# Patient Record
Sex: Female | Born: 1945 | Race: Black or African American | Hispanic: No | Smoking: Former smoker
Health system: Southern US, Community
[De-identification: ages and names within clinical notes are randomized; demographics above are authoritative.]

## PROBLEM LIST (undated history)

## (undated) DIAGNOSIS — J449 Chronic obstructive pulmonary disease, unspecified: Secondary | ICD-10-CM

## (undated) DIAGNOSIS — K219 Gastro-esophageal reflux disease without esophagitis: Secondary | ICD-10-CM

## (undated) DIAGNOSIS — Z860100 Personal history of colon polyps, unspecified: Secondary | ICD-10-CM

## (undated) DIAGNOSIS — IMO0001 Reserved for inherently not codable concepts without codable children: Secondary | ICD-10-CM

## (undated) DIAGNOSIS — I10 Essential (primary) hypertension: Secondary | ICD-10-CM

## (undated) DIAGNOSIS — F5104 Psychophysiologic insomnia: Secondary | ICD-10-CM

## (undated) DIAGNOSIS — E785 Hyperlipidemia, unspecified: Secondary | ICD-10-CM

## (undated) DIAGNOSIS — K5792 Diverticulitis of intestine, part unspecified, without perforation or abscess without bleeding: Secondary | ICD-10-CM

## (undated) DIAGNOSIS — I639 Cerebral infarction, unspecified: Secondary | ICD-10-CM

## (undated) HISTORY — DX: Personal history of colonic polyps: Z86.010

## (undated) HISTORY — DX: Diverticulitis of intestine, part unspecified, without perforation or abscess without bleeding: K57.92

## (undated) HISTORY — DX: Reserved for inherently not codable concepts without codable children: IMO0001

## (undated) HISTORY — PX: ABDOMINAL HYSTERECTOMY: SHX81

## (undated) HISTORY — DX: Psychophysiologic insomnia: F51.04

## (undated) HISTORY — DX: Chronic obstructive pulmonary disease, unspecified: J44.9

## (undated) HISTORY — DX: Cerebral infarction, unspecified: I63.9

## (undated) HISTORY — DX: Essential (primary) hypertension: I10

## (undated) HISTORY — PX: THORACIC AORTIC ANEURYSM REPAIR: SHX799

## (undated) HISTORY — DX: Personal history of colon polyps, unspecified: Z86.0100

## (undated) HISTORY — DX: Gastro-esophageal reflux disease without esophagitis: K21.9

## (undated) HISTORY — DX: Hyperlipidemia, unspecified: E78.5

---

## 2009-06-14 ENCOUNTER — Ambulatory Visit (HOSPITAL_COMMUNITY): Admission: RE | Admit: 2009-06-14 | Discharge: 2009-06-14 | Payer: Self-pay | Admitting: Unknown Physician Specialty

## 2009-06-25 ENCOUNTER — Ambulatory Visit (HOSPITAL_COMMUNITY): Admission: RE | Admit: 2009-06-25 | Discharge: 2009-06-25 | Payer: Self-pay | Admitting: Internal Medicine

## 2009-06-28 ENCOUNTER — Ambulatory Visit (HOSPITAL_COMMUNITY): Admission: RE | Admit: 2009-06-28 | Discharge: 2009-06-28 | Payer: Self-pay | Admitting: Unknown Physician Specialty

## 2009-09-09 ENCOUNTER — Encounter: Admission: RE | Admit: 2009-09-09 | Discharge: 2009-09-09 | Payer: Self-pay | Admitting: Neurological Surgery

## 2009-09-21 LAB — HM COLONOSCOPY

## 2009-12-03 ENCOUNTER — Emergency Department (HOSPITAL_COMMUNITY): Admission: EM | Admit: 2009-12-03 | Discharge: 2009-12-03 | Payer: Self-pay | Admitting: Emergency Medicine

## 2010-01-27 ENCOUNTER — Ambulatory Visit: Payer: Self-pay | Admitting: Vascular Surgery

## 2010-02-06 ENCOUNTER — Encounter: Payer: Self-pay | Admitting: Internal Medicine

## 2010-02-13 ENCOUNTER — Ambulatory Visit (HOSPITAL_COMMUNITY): Admission: RE | Admit: 2010-02-13 | Discharge: 2010-02-13 | Payer: Self-pay | Admitting: Unknown Physician Specialty

## 2010-02-19 LAB — HM MAMMOGRAPHY: HM Mammogram: NORMAL

## 2010-02-25 LAB — CONVERTED CEMR LAB: Pap Smear: NORMAL

## 2010-09-08 ENCOUNTER — Ambulatory Visit: Payer: Self-pay | Admitting: Internal Medicine

## 2010-09-08 ENCOUNTER — Encounter: Payer: Self-pay | Admitting: Internal Medicine

## 2010-09-08 DIAGNOSIS — I714 Abdominal aortic aneurysm, without rupture, unspecified: Secondary | ICD-10-CM | POA: Insufficient documentation

## 2010-09-08 DIAGNOSIS — E785 Hyperlipidemia, unspecified: Secondary | ICD-10-CM

## 2010-09-08 DIAGNOSIS — R0789 Other chest pain: Secondary | ICD-10-CM

## 2010-09-08 DIAGNOSIS — Z860100 Personal history of colon polyps, unspecified: Secondary | ICD-10-CM | POA: Insufficient documentation

## 2010-09-08 DIAGNOSIS — I10 Essential (primary) hypertension: Secondary | ICD-10-CM

## 2010-09-08 DIAGNOSIS — Z8679 Personal history of other diseases of the circulatory system: Secondary | ICD-10-CM | POA: Insufficient documentation

## 2010-09-08 DIAGNOSIS — R7309 Other abnormal glucose: Secondary | ICD-10-CM | POA: Insufficient documentation

## 2010-09-08 DIAGNOSIS — J449 Chronic obstructive pulmonary disease, unspecified: Secondary | ICD-10-CM

## 2010-09-08 DIAGNOSIS — K219 Gastro-esophageal reflux disease without esophagitis: Secondary | ICD-10-CM

## 2010-09-08 DIAGNOSIS — Z8719 Personal history of other diseases of the digestive system: Secondary | ICD-10-CM | POA: Insufficient documentation

## 2010-09-08 DIAGNOSIS — J4489 Other specified chronic obstructive pulmonary disease: Secondary | ICD-10-CM | POA: Insufficient documentation

## 2010-09-08 DIAGNOSIS — F17200 Nicotine dependence, unspecified, uncomplicated: Secondary | ICD-10-CM

## 2010-09-08 LAB — CONVERTED CEMR LAB
ALT: 44 units/L — ABNORMAL HIGH (ref 0–35)
AST: 32 units/L (ref 0–37)
Albumin: 3.9 g/dL (ref 3.5–5.2)
Alkaline Phosphatase: 82 units/L (ref 39–117)
Basophils Absolute: 0 10*3/uL (ref 0.0–0.1)
Creatinine, Ser: 0.7 mg/dL (ref 0.4–1.2)
Eosinophils Relative: 2.3 % (ref 0.0–5.0)
HDL goal, serum: 40 mg/dL
Hemoglobin: 13.1 g/dL (ref 12.0–15.0)
Hgb A1c MFr Bld: 5.8 % (ref 4.6–6.5)
Lymphs Abs: 2.4 10*3/uL (ref 0.7–4.0)
MCHC: 34.4 g/dL (ref 30.0–36.0)
MCV: 86.9 fL (ref 78.0–100.0)
Monocytes Absolute: 0.4 10*3/uL (ref 0.1–1.0)
Monocytes Relative: 7.4 % (ref 3.0–12.0)
Neutro Abs: 2.4 10*3/uL (ref 1.4–7.7)
Neutrophils Relative %: 45.2 % (ref 43.0–77.0)
Platelets: 361 10*3/uL (ref 150.0–400.0)
RBC: 4.37 M/uL (ref 3.87–5.11)
TSH: 2.07 microintl units/mL (ref 0.35–5.50)
Total Protein: 7.3 g/dL (ref 6.0–8.3)
Triglycerides: 192 mg/dL — ABNORMAL HIGH (ref 0.0–149.0)

## 2010-09-09 ENCOUNTER — Telehealth (INDEPENDENT_AMBULATORY_CARE_PROVIDER_SITE_OTHER): Payer: Self-pay | Admitting: *Deleted

## 2010-09-09 ENCOUNTER — Encounter: Payer: Self-pay | Admitting: Internal Medicine

## 2010-09-16 ENCOUNTER — Telehealth: Payer: Self-pay | Admitting: Internal Medicine

## 2010-09-16 ENCOUNTER — Ambulatory Visit: Payer: Self-pay | Admitting: Cardiology

## 2010-09-17 ENCOUNTER — Ambulatory Visit (HOSPITAL_COMMUNITY)
Admission: RE | Admit: 2010-09-17 | Discharge: 2010-09-17 | Payer: Self-pay | Source: Home / Self Care | Attending: Internal Medicine | Admitting: Internal Medicine

## 2010-09-18 ENCOUNTER — Encounter: Payer: Self-pay | Admitting: Internal Medicine

## 2010-09-18 ENCOUNTER — Telehealth: Payer: Self-pay | Admitting: Internal Medicine

## 2010-09-18 DIAGNOSIS — R740 Nonspecific elevation of levels of transaminase and lactic acid dehydrogenase [LDH]: Secondary | ICD-10-CM

## 2010-09-18 DIAGNOSIS — E8881 Metabolic syndrome: Secondary | ICD-10-CM | POA: Insufficient documentation

## 2010-09-18 LAB — CONVERTED CEMR LAB
ALT: 17 units/L (ref 0–35)
AST: 19 units/L (ref 0–37)
Albumin: 4 g/dL (ref 3.5–5.2)
Alkaline Phosphatase: 72 units/L (ref 39–117)
Bilirubin, Direct: 0 mg/dL (ref 0.0–0.3)
Hep B Core Total Ab: NEGATIVE
Hep B S Ab: NEGATIVE
Total Protein: 7.8 g/dL (ref 6.0–8.3)

## 2010-09-19 ENCOUNTER — Ambulatory Visit: Payer: Self-pay | Admitting: Internal Medicine

## 2010-09-23 ENCOUNTER — Encounter
Admission: RE | Admit: 2010-09-23 | Discharge: 2010-09-23 | Payer: Self-pay | Source: Home / Self Care | Attending: Internal Medicine | Admitting: Internal Medicine

## 2010-09-25 ENCOUNTER — Telehealth: Payer: Self-pay | Admitting: Internal Medicine

## 2010-09-29 ENCOUNTER — Ambulatory Visit: Admit: 2010-09-29 | Payer: Self-pay | Admitting: Surgery

## 2010-10-12 ENCOUNTER — Encounter: Payer: Self-pay | Admitting: Surgery

## 2010-10-16 ENCOUNTER — Telehealth: Payer: Self-pay | Admitting: Internal Medicine

## 2010-10-20 ENCOUNTER — Ambulatory Visit: Admit: 2010-10-20 | Payer: Self-pay | Admitting: Surgery

## 2010-10-21 NOTE — Letter (Signed)
Summary: [facility] Office Note  [facility] Office Note   Imported By: Roderic Ovens 03/05/2010 09:32:24  _____________________________________________________________________  External Attachment:    Type:   Image     Comment:   External Document

## 2010-10-23 NOTE — Progress Notes (Signed)
  Phone Note Call from Patient Call back at Home Phone [phone]   Caller: Patient---[phone] Call For: [name] MD Summary of Call: Pt seen yesterday and knot found back of left shoulder. Pt states she was given the name of tumor on back of her shoulder but forgets, please advise. Initial call taken by: Verdell Face,  September 09, 2010 9:59 AM  Follow-up for Phone Call        lipoma Follow-up by: [name] MD,  September 09, 2010 10:15 AM

## 2010-10-23 NOTE — Progress Notes (Signed)
Summary: [place] results  Phone Note Call from Patient Call back at Home Phone [phone]   Caller: [phone] Call For: [name] MD Summary of Call: Pt requests call with  results please. Initial call taken by: Verdell Face,  September 25, 2010 11:59 AM  Follow-up for Phone Call        ultrasound shows fatty liver disease Follow-up by: [name] MD,  September 25, 2010 4:06 PM  Additional Follow-up for Phone Call Additional follow up Details #1::        Pt informed. Pt is wondering what the next step is regarding diagnosis and is this something that can be treated? Additional Follow-up by: Brenton Grills CMA Duncan Dull),  September 26, 2010 12:04 PM    Additional Follow-up for Phone Call Additional follow up Details #2::    weight loss, low calorie diet, actos is a med that works as well Follow-up by: [name] MD,  September 26, 2010 12:13 PM  Additional Follow-up for Phone Call Additional follow up Details #3:: Details for Additional Follow-up Action Taken: left message for pt to callback office .Marland KitchenMarland KitchenMarland KitchenBrenton Grills CMA Duncan Dull)  September 26, 2010 2:56 PM   pt informed of MD's advisement Additional Follow-up by: Brenton Grills CMA Duncan Dull),  September 26, 2010 4:13 PM

## 2010-10-23 NOTE — Assessment & Plan Note (Signed)
Summary: PER PT FU--RECEIVED LETTER TO SCHED---STC   Vital Signs:  Patient profile:   65 year old female Menstrual status:  hysterectomy Height:      66 inches (167.64 cm) Weight:      156.38 pounds (71.08 kg) BMI:     25.33 O2 Sat:      97 % on Room air Temp:     98.4 degrees F (36.89 degrees C) oral Pulse rate:   89 / minute Pulse rhythm:   regular Resp:     16 per minute BP sitting:   134 / 78  (left arm) Cuff size:   large  Vitals Entered By: Brenton Grills CMA Duncan Dull) (September 18, 2010 11:31 AM)  Nutrition Counseling: Patient's BMI is greater than 25 and therefore counseled on weight management options.  O2 Flow:  Room air CC: F/U on CT scan, discuss lab results/aj, Lipid Management Is Patient Diabetic? No Pain Assessment Patient in pain? no        Primary Care Provider:  [name] MD  CC:  F/U on CT scan, discuss lab results/aj, and Lipid Management.  History of Present Illness:  Follow-Up Visit      This is a 65 year old woman who presents for Follow-up visit.  The patient denies chest pain, palpitations, dizziness, syncope, low blood sugar symptoms, high blood sugar symptoms, edema, SOB, DOE, PND, and orthopnea.  Since the last visit the patient notes no new problems or concerns.  The patient reports taking meds as prescribed and monitoring BP.  When questioned about possible medication side effects, the patient notes none.  She is concerned about the CT report that raises the possibility of leak in the TAA. She is seeing vasc. surgery tomorrow. She has had no more chest pain. She is concerned about the slightly elevated liver enzymes.  Lipid Management History:      Positive NCEP/ATP III risk factors include female age 33 years old or older, current tobacco user, hypertension, prior stroke (or TIA), peripheral vascular disease, and aortic aneurysm.        The patient states that she knows about the "Therapeutic Lifestyle Change" diet.  Her compliance with the  TLC diet is good.  The patient expresses understanding of adjunctive measures for cholesterol lowering.  Adjunctive measures started by the patient include aerobic exercise, fiber, ASA, omega-3 supplements, limit alcohol consumpton, and weight reduction.  She expresses no side effects from her lipid-lowering medication.  The patient denies any symptoms to suggest myopathy or liver disease.     Preventive Screening-Counseling & Management  Alcohol-Tobacco     Alcohol drinks/day: <1     Alcohol type: all     >5/day in last 3 mos: no     Alcohol Counseling: not indicated; use of alcohol is not excessive or problematic     Feels need to cut down: no     Feels annoyed by complaints: no     Feels guilty re: drinking: no     Needs 'eye opener' in am: no     Smoking Status: current     Smoking Cessation Counseling: yes     Smoke Cessation Stage: precontemplative     Packs/Day: 0.5     Year Started: 1970     Pack years: 30     Tobacco Counseling: to quit use of tobacco products  Hep-HIV-STD-Contraception     Hepatitis Risk: no risk noted     HIV Risk: no risk noted  STD Risk: no risk noted     Dental Visit-last 6 months yes     Dental Care Counseling: to seek dental care; no dental care within six months     SBE monthly: no     SBE Education/Counseling: to perform regular SBE      Sexual History:  currently monogamous.        Drug Use:  never.        Blood Transfusions:  yes and after 2001.    Clinical Review Panels:  Prevention   Last Mammogram:  normal (02/19/2010)   Last Pap Smear:  Normal (02/25/2010)   Last Colonoscopy:  done (09/21/2009)  Immunizations   Last Tetanus Booster:  Historical (09/21/2006)   Last Flu Vaccine:  Fluvax 3+ (09/08/2010)  Lipid Management   Cholesterol:  172 (09/08/2010)   LDL (bad choesterol):  84 (09/08/2010)   HDL (good cholesterol):  49.70 (09/08/2010)  Diabetes Management   HgBA1C:  5.8 (09/08/2010)   Creatinine:  0.7 (09/08/2010)    Last Flu Vaccine:  Fluvax 3+ (09/08/2010)  CBC   WBC:  5.4 (09/08/2010)   RBC:  4.37 (09/08/2010)   Hgb:  13.1 (09/08/2010)   Hct:  38.0 (09/08/2010)   Platelets:  361.0 (09/08/2010)   MCV  86.9 (09/08/2010)   MCHC  34.4 (09/08/2010)   RDW  13.9 (09/08/2010)   PMN:  45.2 (09/08/2010)   Lymphs:  44.5 (09/08/2010)   Monos:  7.4 (09/08/2010)   Eosinophils:  2.3 (09/08/2010)   Basophil:  0.6 (09/08/2010)  Complete Metabolic Panel   Glucose:  81 (09/08/2010)   Sodium:  143 (09/08/2010)   Potassium:  3.6 (09/08/2010)   Chloride:  105 (09/08/2010)   CO2:  29 (09/08/2010)   BUN:  11 (09/08/2010)   Creatinine:  0.7 (09/08/2010)   Albumin:  3.9 (09/08/2010)   Total Protein:  7.3 (09/08/2010)   Calcium:  9.7 (09/08/2010)   Total Bili:  0.9 (09/08/2010)   Alk Phos:  82 (09/08/2010)   SGPT (ALT):  44 (09/08/2010)   SGOT (AST):  32 (09/08/2010)   Current Medications (verified): 1)  Lunesta 2 Mg Tabs (Eszopiclone) .... One Po Qhs As Needed 2)  Tramadol Hcl 50 Mg Tabs (Tramadol Hcl) .... Take 1 Tablet By Mouth Four Times A Day 3)  Amlodipine Besylate 10 Mg Tabs (Amlodipine Besylate) .... Take 1 Tablet By Mouth Once A Day 4)  Metoprolol Succinate 50 Mg Xr24h-Tab (Metoprolol Succinate) .... Take 1 Tablet By Mouth Once A Day 5)  Vytorin 10-20 Mg Tabs (Ezetimibe-Simvastatin) .... Take 1 Tablet By Mouth Once A Day 6)  Aspirin 81 Mg Tbec (Aspirin) .... Take 1 Tablet By Mouth Once A Day 7)  Prednisone 50 Mg Tabs (Prednisone) .... One Tab At 13 Hours, 7 Hours, and 1 Hour Prior To Ct Scan 8)  Diphenhydramine Hcl 50 Mg Caps (Diphenhydramine Hcl) .... One Dose One Hour Prior To Ct Scan  Allergies (verified): 1)  ! Penicillin 2)  ! Percocet 3)  ! Xanax 4)  ! Oxycodone Hcl 5)  ! Codeine  Past History:  Past Medical History: Last updated: 09/08/2010 Left cerebellar stroke - asymptomatic Hypertension Hyperlipidemia Diabetes, borderline Thoracic aneurysm - repaired at [facility] in  2008 Colonic polyps, hx of COPD Diverticulitis, hx of GERD Cerebrovascular accident, hx of Chronic/intermittent insomnia  Past Surgical History: Last updated: 09/08/2010 Thoracic aortic aneurysm repair Hysterectomy  Family History: Last updated: 02/10/2010 Positive for stroke in her mother.   Negative for coronary artery disease or  diabetes.   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  She smokes half a pack of cigarettes per day, has done so for 40+ years.  Does not use alcohol.   Risk Factors: Alcohol Use: <1 (09/18/2010) >5 drinks/d w/in last 3 months: no (09/18/2010)  Risk Factors: Smoking Status: current (09/18/2010) Packs/Day: 0.5 (09/18/2010)  Family History: Reviewed history from 02/10/2010 and no changes required. Positive for stroke in her mother.   Negative for coronary artery disease or diabetes.   Social History: Reviewed history from 02/10/2010 and no changes required. 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  She smokes half a pack of cigarettes per day, has done so for 40+ years.  Does not use alcohol.   Review of Systems  The patient denies anorexia, fever, weight loss, weight gain, chest pain, syncope, dyspnea on exertion, peripheral edema, prolonged cough, headaches, hemoptysis, abdominal pain, hematuria, muscle weakness, suspicious skin lesions, difficulty walking, depression, abnormal bleeding, enlarged lymph nodes, and breast masses.   GI:  Denies abdominal pain, bloody stools, change in bowel habits, constipation, diarrhea, excessive appetite, indigestion, loss of appetite, nausea, vomiting, and yellowish skin color.  Physical Exam  General:  alert, well-developed, well-nourished, well-hydrated, appropriate dress, normal appearance, healthy-appearing, cooperative to examination, and good  hygiene.   Head:  normocephalic, atraumatic, no abnormalities observed, and no abnormalities palpated.   Eyes:  vision grossly intact, pupils equal, pupils round, pupils reactive to light, and no injection.   Mouth:  Oral mucosa and oropharynx without lesions or exudates.  Teeth in good repair. Neck:  supple, full ROM, no masses, no thyromegaly, no thyroid nodules or tenderness, no JVD, normal carotid upstroke, no carotid bruits, no cervical lymphadenopathy, and no neck tenderness.   Lungs:  normal respiratory effort, no intercostal retractions, no accessory muscle use, normal breath sounds, no dullness, no fremitus, no crackles, and no wheezes.   Heart:  normal rate, regular rhythm, no murmur, no gallop, no rub, and no JVD.   Abdomen:  soft, non-tender, normal bowel sounds, no distention, no masses, no guarding, no rigidity, no rebound tenderness, no abdominal hernia, no inguinal hernia, no hepatomegaly, and no splenomegaly.   Msk:  No deformity or scoliosis noted of thoracic or lumbar spine.   Pulses:  R and L carotid,radial,femoral,dorsalis pedis and posterior tibial pulses are full and equal bilaterally Extremities:  No clubbing, cyanosis, edema, or deformity noted with normal full range of motion of all joints.   Neurologic:  No cranial nerve deficits noted. Station and gait are normal. Plantar reflexes are down-going bilaterally. DTRs are symmetrical throughout. Sensory, motor and coordinative functions appear intact. Skin:  Intact without suspicious lesions or rashes Cervical Nodes:  no anterior cervical adenopathy and no posterior cervical adenopathy.   Psych:  Cognition and judgment appear intact. Alert and cooperative with normal attention span and concentration. No apparent delusions, illusions, hallucinations   Impression & Recommendations:  Problem # 1:  TRANSAMINASES, SERUM, ELEVATED (ICD-790.4) Assessment New  Orders: TLB-Hepatic/Liver Function Pnl (80076-HEPATIC) TLB-Ferritin  (82728-FER) T-Hepatitis C Anti HCV (16109) T-Hepatitis B Surface Antigen (60454-09811) T-Hepatitis B Surface Antibody (91478-29562) T-Hepatitis A Antibody (13086-57846) T-Hepatitis B Core Total [phone]) T-Sprue Panel (Celiac Disease Aby Eval) (83516x3/[address]) T-AMA (24401) Radiology Referral (Radiology)  Problem # 2:  HYPERTENSION (ICD-401.9) Assessment: Improved  Her updated medication list for this  problem includes:    Amlodipine Besylate 10 Mg Tabs (Amlodipine besylate) .Marland Kitchen... Take 1 tablet by mouth once a day    Metoprolol Succinate 50 Mg Xr24h-tab (Metoprolol succinate) .Marland Kitchen... Take 1 tablet by mouth once a day  BP today: 140/78 Prior BP: 106/68 (09/08/2010)  Prior 10 Yr Risk Heart Disease: Not enough information (09/08/2010)  Labs Reviewed: K+: 3.6 (09/08/2010) Creat: : 0.7 (09/08/2010)   Chol: 172 (09/08/2010)   HDL: 49.70 (09/08/2010)   LDL: 84 (09/08/2010)   TG: 192.0 (09/08/2010)  Orders: Tobacco use cessation intermediate 3-10 minutes (16109)  Problem # 3:  HYPERLIPIDEMIA (ICD-272.4) Assessment: Unchanged  Her updated medication list for this problem includes:    Vytorin 10-20 Mg Tabs (Ezetimibe-simvastatin) .Marland Kitchen... Take 1 tablet by mouth once a day  Labs Reviewed: SGOT: 32 (09/08/2010)   SGPT: 44 (09/08/2010)  Lipid Goals: Chol Goal: 200 (09/08/2010)   HDL Goal: 40 (09/08/2010)   LDL Goal: 70 (09/08/2010)   TG Goal: 150 (09/08/2010)  Prior 10 Yr Risk Heart Disease: Not enough information (09/08/2010)   HDL:49.70 (09/08/2010)  LDL:84 (09/08/2010)  Chol:172 (09/08/2010)  Trig:192.0 (09/08/2010)  Problem # 4:  TOBACCO USE (ICD-305.1) Assessment: Unchanged  Encouraged smoking cessation and discussed different methods for smoking cessation.   Orders: Tobacco use cessation intermediate 3-10 minutes (60454)  Problem # 5:  DYSMETABOLIC SYNDROME (ICD-277.7) Assessment: New  Problem # 6:  HYPERGLYCEMIA (ICD-790.29) Assessment: Unchanged  Labs  Reviewed: Creat: 0.7 (09/08/2010)     Complete Medication List: 1)  Lunesta 2 Mg Tabs (Eszopiclone) .... One po qhs as needed 2)  Tramadol Hcl 50 Mg Tabs (Tramadol hcl) .... Take 1 tablet by mouth four times a day 3)  Amlodipine Besylate 10 Mg Tabs (Amlodipine besylate) .... Take 1 tablet by mouth once a day 4)  Metoprolol Succinate 50 Mg Xr24h-tab (Metoprolol succinate) .... Take 1 tablet by mouth once a day 5)  Vytorin 10-20 Mg Tabs (Ezetimibe-simvastatin) .... Take 1 tablet by mouth once a day 6)  Aspirin 81 Mg Tbec (Aspirin) .... Take 1 tablet by mouth once a day 7)  Prednisone 50 Mg Tabs (Prednisone) .... One tab at 13 hours, 7 hours, and 1 hour prior to ct scan 8)  Diphenhydramine Hcl 50 Mg Caps (Diphenhydramine hcl) .... One dose one hour prior to ct scan  Lipid Assessment/Plan:      Based on NCEP/ATP III, the patient's risk factor category is "history of coronary disease, peripheral vascular disease, cerebrovascular disease, or aortic aneurysm along with either diabetes, current smoker, or LDL > 130 plus HDL < 40 plus triglycerides > 200".  The patient's lipid goals are as follows: Total cholesterol goal is 200; LDL cholesterol goal is 70; HDL cholesterol goal is 40; Triglyceride goal is 150.    Patient Instructions: 1)  Please schedule a follow-up appointment in 1 month. 2)  Tobacco is very bad for your health and your loved ones! You Should stop smoking!. 3)  Stop Smoking Tips: Choose a Quit date. Cut down before the Quit date. decide what you will do as a substitute when you feel the urge to smoke(gum,toothpick,exercise). 4)  It is important that you exercise regularly at least 20 minutes 5 times a week. If you develop chest pain, have severe difficulty breathing, or feel very tired , stop exercising immediately and seek medical attention. 5)  You need to lose weight. Consider a lower calorie diet and regular exercise.  6)  Check your Blood Pressure regularly. If it is above 130/80:  you should make an appointment.   Orders Added: 1)  TLB-Hepatic/Liver Function Pnl [80076-HEPATIC] 2)  TLB-Ferritin [82728-FER] 3)  T-Hepatitis C Anti HCV [16109] 4)  T-Hepatitis B Surface Antigen [60454-09811] 5)  T-Hepatitis B Surface Antibody [91478-29562] 6)  T-Hepatitis A Antibody [13086-57846] 7)  T-Hepatitis B Core Total [86704-23570] 8)  T-Sprue Panel (Celiac Disease Aby Eval) [83516x3/[address]] 9)  T-AMA [96295] 10)  Radiology Referral [Radiology] 11)  Tobacco use cessation intermediate 3-10 minutes [99406] 12)  Est. Patient Level IV [28413]

## 2010-10-23 NOTE — Assessment & Plan Note (Signed)
Summary: New / [employer] / # / cd   Vital Signs:  Patient profile:   65 year old female Menstrual status:  hysterectomy Height:      66 inches Weight:      156.25 pounds BMI:     25.31 O2 Sat:      94 % on Room air Temp:     98.3 degrees F oral Pulse rate:   65 / minute Pulse rhythm:   regular Resp:     16 per minute BP sitting:   106 / 68  (left arm) Cuff size:   large  Vitals Entered By: Rock Nephew CMA (September 08, 2010 1:26 PM)  Nutrition Counseling: Patient's BMI is greater than 25 and therefore counseled on weight management options.  O2 Flow:  Room air  Primary Care Provider:  [name] MD   History of Present Illness: New to me she needs a new PCP. She comaplins of a 2 week history of chest pain that started on the right lateral chest wall then migrated to the back and then the left side. She describes it as a sharp spasm that comes and goes very quickly.  Lipid Management History:      Positive NCEP/ATP III risk factors include female age 31 years old or older, current tobacco user, hypertension, prior stroke (or TIA), peripheral vascular disease, and aortic aneurysm.        The patient states that she knows about the "Therapeutic Lifestyle Change" diet.  Her compliance with the TLC diet is good.  The patient expresses understanding of adjunctive measures for cholesterol lowering.  Adjunctive measures started by the patient include aerobic exercise, fiber, ASA, folic acid, limit alcohol consumpton, and weight reduction.  She expresses no side effects from her lipid-lowering medication.  The patient denies any symptoms to suggest myopathy or liver disease.     Preventive Screening-Counseling & Management  Alcohol-Tobacco     Alcohol drinks/day: <1     Alcohol type: all     >5/day in last 3 mos: no     Alcohol Counseling: not indicated; use of alcohol is not excessive or problematic     Feels need to cut down: no     Feels annoyed by complaints: no     Feels  guilty re: drinking: no     Needs 'eye opener' in am: no     Smoking Status: current     Smoking Cessation Counseling: yes     Smoke Cessation Stage: precontemplative     Packs/Day: 0.5     Year Started: 1970     Pack years: 30     Tobacco Counseling: to quit use of tobacco products  Hep-HIV-STD-Contraception     Hepatitis Risk: no risk noted     HIV Risk: no risk noted     STD Risk: no risk noted     Dental Visit-last 6 months yes     Dental Care Counseling: to seek dental care; no dental care within six months     SBE monthly: no     SBE Education/Counseling: to perform regular SBE      Sexual History:  currently monogamous.        Drug Use:  never.        Blood Transfusions:  yes and after 2001.    Clinical Review Panels:  Prevention   Last Mammogram:  normal (02/19/2010)   Last Pap Smear:  Normal (02/25/2010)   Last Colonoscopy:  done (09/21/2009)  Immunizations   Last Tetanus Booster:  Historical (09/21/2006)   Last Flu Vaccine:  Fluvax 3+ (09/08/2010)  Diabetes Management   Last Flu Vaccine:  Fluvax 3+ (09/08/2010)   Medications Prior to Update: 1)  None  Current Medications (verified): 1)  Lunesta 2 Mg Tabs (Eszopiclone) .... One Po Qhs As Needed 2)  Tramadol Hcl 50 Mg Tabs (Tramadol Hcl) .... Take 1 Tablet By Mouth Four Times A Day 3)  Amlodipine Besylate 10 Mg Tabs (Amlodipine Besylate) .... Take 1 Tablet By Mouth Once A Day 4)  Metoprolol Succinate 50 Mg Xr24h-Tab (Metoprolol Succinate) .... Take 1 Tablet By Mouth Once A Day 5)  Vytorin 10-20 Mg Tabs (Ezetimibe-Simvastatin) .... Take 1 Tablet By Mouth Once A Day 6)  Aspirin 81 Mg Tbec (Aspirin) .... Take 1 Tablet By Mouth Once A Day  Allergies (verified): 1)  ! Penicillin 2)  ! Percocet 3)  ! Xanax 4)  ! Oxycodone Hcl 5)  ! Codeine  Past History:  Past Medical History: Left cerebellar stroke - asymptomatic Hypertension Hyperlipidemia Diabetes, borderline Thoracic aneurysm - repaired at [facility] in 2008 Colonic polyps, hx of COPD Diverticulitis, hx of GERD Cerebrovascular accident, hx of Chronic/intermittent insomnia  Past Surgical History: Thoracic aortic aneurysm repair Hysterectomy  Social History: Smoking Status:  current Packs/Day:  0.5 Hepatitis Risk:  no risk noted HIV Risk:  no risk noted STD Risk:  no risk noted Dental Care w/in 6 mos.:  yes Sexual History:  currently monogamous Drug Use:  never Blood Transfusions:  yes, after 2001  Review of Systems       The patient complains of chest pain.  The patient denies anorexia, fever, weight loss, weight gain, hoarseness, syncope, dyspnea on exertion, peripheral edema, prolonged cough, headaches, hemoptysis, abdominal pain, melena, hematochezia, severe indigestion/heartburn, hematuria, incontinence, genital sores, muscle weakness, suspicious skin lesions, transient blindness, difficulty walking, depression, abnormal bleeding, enlarged lymph nodes, angioedema, and breast masses.   CV:  Complains of chest pain or discomfort; denies bluish discoloration of lips or nails, difficulty breathing at night, difficulty breathing while lying down, fainting, fatigue, leg cramps with exertion, lightheadness, near fainting, palpitations, shortness of breath with exertion, swelling of feet, swelling of hands, and weight gain. Resp:  Complains of chest discomfort; denies chest pain with inspiration, cough, coughing up blood, excessive snoring, hypersomnolence, morning headaches, pleuritic, shortness of breath, sputum productive, and wheezing. Endo:  Denies cold intolerance, excessive hunger, excessive thirst, excessive urination, heat intolerance, polyuria, and weight change.  Physical Exam  General:  alert, well-developed, well-nourished, well-hydrated, appropriate dress, normal appearance, healthy-appearing, cooperative to examination, and good hygiene.   Head:  normocephalic, atraumatic, no abnormalities observed, and no  abnormalities palpated.   Eyes:  vision grossly intact, pupils equal, pupils round, pupils reactive to light, and no injection.   Ears:  R ear normal and L ear normal.   Nose:  External nasal examination shows no deformity or inflammation. Nasal mucosa are pink and moist without lesions or exudates. Mouth:  Oral mucosa and oropharynx without lesions or exudates.  Teeth in good repair. Neck:  supple, full ROM, no masses, no thyromegaly, no thyroid nodules or tenderness, no JVD, normal carotid upstroke, no carotid bruits, no cervical lymphadenopathy, and no neck tenderness.   Breasts:  skin/areolae normal, no masses, no abnormal thickening, no nipple discharge, no tenderness, and no adenopathy.   Lungs:  normal respiratory effort, no intercostal retractions, no accessory muscle use, normal breath sounds, no dullness, no fremitus, no crackles,  and no wheezes.   Heart:  normal rate, regular rhythm, no murmur, no gallop, no rub, and no JVD.   Abdomen:  soft, non-tender, normal bowel sounds, no distention, no masses, no guarding, no rigidity, no rebound tenderness, no abdominal hernia, no inguinal hernia, no hepatomegaly, and no splenomegaly.   Msk:  No deformity or scoliosis noted of thoracic or lumbar spine.   Pulses:  R and L carotid,radial,femoral,dorsalis pedis and posterior tibial pulses are full and equal bilaterally Extremities:  No clubbing, cyanosis, edema, or deformity noted with normal full range of motion of all joints.   Neurologic:  No cranial nerve deficits noted. Station and gait are normal. Plantar reflexes are down-going bilaterally. DTRs are symmetrical throughout. Sensory, motor and coordinative functions appear intact. Skin:  Intact without suspicious lesions or rashes Cervical Nodes:  no anterior cervical adenopathy and no posterior cervical adenopathy.   Axillary Nodes:  no R axillary adenopathy and no L axillary adenopathy.   Inguinal Nodes:  no R inguinal adenopathy and no L  inguinal adenopathy.   Psych:  Cognition and judgment appear intact. Alert and cooperative with normal attention span and concentration. No apparent delusions, illusions, hallucinations Additional Exam:  EKG is normal   Impression & Recommendations:  Problem # 1:  CHEST PAIN, ATYPICAL (ICD-786.59) Assessment New her pain is very atypical, with her history of TAA I will check a chest xray Orders: T-2 View CXR (71020TC) EKG w/ Interpretation (93000)  Problem # 2:  HYPERGLYCEMIA (ICD-790.29) Assessment: Unchanged  Orders: Venipuncture (16109) TLB-Lipid Panel (80061-LIPID) TLB-BMP (Basic Metabolic Panel-BMET) (80048-METABOL) TLB-CBC Platelet - w/Differential (85025-CBCD) TLB-Hepatic/Liver Function Pnl (80076-HEPATIC) TLB-TSH (Thyroid Stimulating Hormone) (84443-TSH) TLB-A1C / Hgb A1C (Glycohemoglobin) (83036-A1C)  Problem # 3:  HYPERTENSION (ICD-401.9) Assessment: Improved  Her updated medication list for this problem includes:    Amlodipine Besylate 10 Mg Tabs (Amlodipine besylate) .Marland Kitchen... Take 1 tablet by mouth once a day    Metoprolol Succinate 50 Mg Xr24h-tab (Metoprolol succinate) .Marland Kitchen... Take 1 tablet by mouth once a day  Orders: Venipuncture (60454) TLB-Lipid Panel (80061-LIPID) TLB-BMP (Basic Metabolic Panel-BMET) (80048-METABOL) TLB-CBC Platelet - w/Differential (85025-CBCD) TLB-Hepatic/Liver Function Pnl (80076-HEPATIC) TLB-TSH (Thyroid Stimulating Hormone) (84443-TSH) TLB-A1C / Hgb A1C (Glycohemoglobin) (83036-A1C) Tobacco use cessation intermediate 3-10 minutes (99406)  BP today: 106/68  10 Yr Risk Heart Disease: Not enough information  Problem # 4:  AAA (ICD-441.4) Assessment: Unchanged she needs an updated CT of chest and abd to follow the aneurysm s/p repair Orders: Radiology Referral (Radiology) Venipuncture (09811) TLB-Lipid Panel (80061-LIPID) TLB-BMP (Basic Metabolic Panel-BMET) (80048-METABOL) TLB-CBC Platelet - w/Differential  (85025-CBCD) TLB-Hepatic/Liver Function Pnl (80076-HEPATIC) TLB-TSH (Thyroid Stimulating Hormone) (84443-TSH) TLB-A1C / Hgb A1C (Glycohemoglobin) (83036-A1C) T-2 View CXR (71020TC) Tobacco use cessation intermediate 3-10 minutes (91478)  Problem # 5:  TOBACCO USE (ICD-305.1) Assessment: Unchanged  Encouraged smoking cessation and discussed different methods for smoking cessation.   Orders: Tobacco use cessation intermediate 3-10 minutes (29562)  Problem # 6:  HYPERLIPIDEMIA (ICD-272.4) Assessment: Unchanged  Her updated medication list for this problem includes:    Vytorin 10-20 Mg Tabs (Ezetimibe-simvastatin) .Marland Kitchen... Take 1 tablet by mouth once a day  Orders: Venipuncture (13086) TLB-Lipid Panel (80061-LIPID) TLB-BMP (Basic Metabolic Panel-BMET) (80048-METABOL) TLB-CBC Platelet - w/Differential (85025-CBCD) TLB-Hepatic/Liver Function Pnl (80076-HEPATIC) TLB-TSH (Thyroid Stimulating Hormone) (84443-TSH) TLB-A1C / Hgb A1C (Glycohemoglobin) (83036-A1C)  Lipid Goals: Chol Goal: 200 (09/08/2010)   HDL Goal: 40 (09/08/2010)   LDL Goal: 70 (09/08/2010)   TG Goal: 150 (09/08/2010)  10 Yr Risk Heart Disease: Not enough  information  Complete Medication List: 1)  Lunesta 2 Mg Tabs (Eszopiclone) .... One po qhs as needed 2)  Tramadol Hcl 50 Mg Tabs (Tramadol hcl) .... Take 1 tablet by mouth four times a day 3)  Amlodipine Besylate 10 Mg Tabs (Amlodipine besylate) .... Take 1 tablet by mouth once a day 4)  Metoprolol Succinate 50 Mg Xr24h-tab (Metoprolol succinate) .... Take 1 tablet by mouth once a day 5)  Vytorin 10-20 Mg Tabs (Ezetimibe-simvastatin) .... Take 1 tablet by mouth once a day 6)  Aspirin 81 Mg Tbec (Aspirin) .... Take 1 tablet by mouth once a day  Other Orders: Flu Vaccine 95yrs + (13086) Admin 1st Vaccine (57846)  Lipid Assessment/Plan:      Based on NCEP/ATP III, the patient's risk factor category is "history of coronary disease, peripheral vascular disease,  cerebrovascular disease, or aortic aneurysm along with either diabetes, current smoker, or LDL > 130 plus HDL < 40 plus triglycerides > 200".  The patient's lipid goals are as follows: Total cholesterol goal is 200; LDL cholesterol goal is 70; HDL cholesterol goal is 40; Triglyceride goal is 150.    Colorectal Screening:  Current Recommendations:    Hemoccult: patient refused  PAP Screening:    Hx Cervical Dysplasia in last 5 yrs? No    3 normal PAP smears in last 5 yrs? Yes    Last PAP smear:  02/25/2010  PAP Smear Results:    Date of Exam:  02/25/2010    Results:  Normal  Mammogram Screening:    Last Mammogram:  02/19/2010  Osteoporosis Risk Assessment:  Risk Factors for Fracture or Low Bone Density:   Smoking status:       current  Immunization & Chemoprophylaxis:    Tetanus vaccine: Historical  (09/21/2006)    Influenza vaccine: Fluvax 3+  (09/08/2010)  Patient Instructions: 1)  Please schedule a follow-up appointment in 1 month. 2)  Tobacco is very bad for your health and your loved ones! You Should stop smoking!. 3)  Stop Smoking Tips: Choose a Quit date. Cut down before the Quit date. decide what you will do as a substitute when you feel the urge to smoke(gum,toothpick,exercise). 4)  It is important that you exercise regularly at least 20 minutes 5 times a week. If you develop chest pain, have severe difficulty breathing, or feel very tired , stop exercising immediately and seek medical attention. 5)  You need to lose weight. Consider a lower calorie diet and regular exercise.  6)  Check your Blood Pressure regularly. If it is above 130/80: you should make an appointment. Prescriptions: VYTORIN 10-20 MG TABS (EZETIMIBE-SIMVASTATIN) Take 1 tablet by mouth once a day  #90 x 3   Entered and Authorized by:   [name] MD   Signed by:   [name] MD on 09/08/2010   Method used:   Electronically to        [facility]* (retail)             ,          Ph:  [phone]       Fax: [phone]   RxID:   [id number] METOPROLOL SUCCINATE 50 MG XR24H-TAB (METOPROLOL SUCCINATE) Take 1 tablet by mouth once a day  #90 x 3   Entered and Authorized by:   [name] MD   Signed by:   [name] MD on 09/08/2010   Method used:   Electronically to  [facility]* (retail)             ,          Ph: [phone]       Fax: [phone]   RxID:   [id number] AMLODIPINE BESYLATE 10 MG TABS (AMLODIPINE BESYLATE) Take 1 tablet by mouth once a day  #90 x 3   Entered and Authorized by:   [name] MD   Signed by:   [name] MD on 09/08/2010   Method used:   Electronically to        [facility]* (retail)             ,          Ph: [phone]       Fax: [phone]   RxID:   [id number] LUNESTA 2 MG TABS (ESZOPICLONE) one po qhs as needed  #30 x 3   Entered and Authorized by:   [name] MD   Signed by:   [name] MD on 09/08/2010   Method used:   Print then Give to Patient   RxID:   [id number]    Orders Added: 1)  Radiology Referral [Radiology] 2)  Venipuncture [01093] 3)  TLB-Lipid Panel [80061-LIPID] 4)  TLB-BMP (Basic Metabolic Panel-BMET) [80048-METABOL] 5)  TLB-CBC Platelet - w/Differential [85025-CBCD] 6)  TLB-Hepatic/Liver Function Pnl [80076-HEPATIC] 7)  TLB-TSH (Thyroid Stimulating Hormone) [84443-TSH] 8)  TLB-A1C / Hgb A1C (Glycohemoglobin) [83036-A1C] 9)  T-2 View CXR [71020TC] 10)  Flu Vaccine 63yrs + [90658] 11)  Admin 1st Vaccine [90471] 12)  EKG w/ Interpretation [93000] 13)  Tobacco use cessation intermediate 3-10 minutes [99406] 14)  New Patient Level V [99205]   Immunizations Administered:  Influenza Vaccine # 1:    Vaccine Type: Fluvax 3+    Site: right deltoid    Mfr: Sanofi Pasteur    Dose: 0.5 ml    Route: IM    Given by: Rock Nephew CMA    Exp. Date: 03/21/2011    Lot #: [id number]    VIS given: 04/15/10 version given September 08, 2010.   Immunizations Administered:  Influenza Vaccine # 1:    Vaccine Type: Fluvax 3+    Site: right deltoid    Mfr: Sanofi Pasteur    Dose: 0.5 ml    Route: IM    Given by: Rock Nephew CMA    Exp. Date: 03/21/2011    Lot #: [id number]    VIS given: 04/15/10 version given September 08, 2010.  Preventive Care Screening  Mammogram:    Date:  02/19/2010    Results:  normal   Pap Smear:    Date:  02/19/2010    Results:  normal   Colonoscopy:    Date:  09/21/2009    Results:  done   Last Tetanus Booster:    Date:  09/21/2006    Results:  Historical

## 2010-10-23 NOTE — Letter (Signed)
Summary: Results Follow-up Letter  [facility]  [address] [place], [place] [address]   Phone: [phone]  Fax: [phone]    09/09/2010  [address] [place], [place]  [address]  Dear Ms. Colombe,   The following are the results of your recent test(s):  Test     Result     Chest Xray     normal Blood sugars   pre-diabetes Liver       one enzyme elevation Kidney     normal CBC       normal Thyroid     normal   _________________________________________________________  Please call for an appointment soon _________________________________________________________ _________________________________________________________ _________________________________________________________  Sincerely,  Sanda Linger MD [facility]

## 2010-10-23 NOTE — Progress Notes (Signed)
Summary: REFERRAL?   Phone Note Call from Patient   Summary of Call: Patient is requesting referral to see a hematologist..... Say our office told her that she had elevated liver enzymes and that is why she wants referral... Initial call taken by: Lamar Sprinkles, CMA,  October 16, 2010 4:22 PM  Follow-up for Phone Call        this makes no sense at all Follow-up by: [name] MD,  October 16, 2010 4:24 PM  Additional Follow-up for Phone Call Additional follow up Details #1::        Spoke w/pt and explained that last liver enzymes were normal (and hematologist does NOT treat this). She should continue wt loss, exercise and healthy diet. Pt agreed.  Additional Follow-up by: Lamar Sprinkles, CMA,  October 16, 2010 6:13 PM

## 2010-10-23 NOTE — Letter (Signed)
Summary: Lipid Letter  [facility]  [address] [place], [place] [address]   Phone: [phone]  Fax: [phone]    09/09/2010  Kim Rowland [address] [place], [place]  [address]  Dear Ms. Klinker:  We have carefully reviewed your last lipid profile from 09/08/2010 and the results are noted below with a summary of recommendations for lipid management.    Cholesterol:       172     Goal: <200   HDL "good" Cholesterol:   01.75     Goal: >40   LDL "bad" Cholesterol:   84     Goal: <70   Triglycerides:       192.0     Goal: <150        TLC Diet (Therapeutic Lifestyle Change): Saturated Fats & Transfatty acids should be kept < 7% of total calories ***Reduce Saturated Fats Polyunstaurated Fat can be up to 10% of total calories Monounsaturated Fat Fat can be up to 20% of total calories Total Fat should be no greater than 25-35% of total calories Carbohydrates should be 50-60% of total calories Protein should be approximately 15% of total calories Fiber should be at least 20-30 grams a day ***Increased fiber may help lower LDL Total Cholesterol should be < 200mg /day Consider adding plant stanol/sterols to diet (example: Benacol spread) ***A higher intake of unsaturated fat may reduce Triglycerides and Increase HDL    Adjunctive Measures (may lower LIPIDS and reduce risk of Heart Attack) include: Aerobic Exercise (20-30 minutes 3-4 times a week) Limit Alcohol Consumption Weight Reduction Aspirin 75-81 mg a day by mouth (if not allergic or contraindicated) Dietary Fiber 20-30 grams a day by mouth     Current Medications: 1)    Lunesta 2 Mg Tabs (Eszopiclone) .... One po qhs as needed 2)    Tramadol Hcl 50 Mg Tabs (Tramadol hcl) .... Take 1 tablet by mouth four times a day 3)    Amlodipine Besylate 10 Mg Tabs (Amlodipine besylate) .... Take 1 tablet by mouth once a day 4)    Metoprolol Succinate 50 Mg Xr24h-tab (Metoprolol succinate) .... Take 1  tablet by mouth once a day 5)    Vytorin 10-20 Mg Tabs (Ezetimibe-simvastatin) .... Take 1 tablet by mouth once a day 6)    Aspirin 81 Mg Tbec (Aspirin) .... Take 1 tablet by mouth once a day  If you have any questions, please call. We appreciate being able to work with you.   Sincerely,    [facility] [name] MD

## 2010-10-23 NOTE — Progress Notes (Signed)
Summary: Allergy to IV contrast-Need Rx and new Order  Phone Note Other Incoming   Caller: Kim Rowland in CT Summary of Call: pt is allergic to IV contrast. She is there now but they are informing that she cant have CT today and we will call in appropriate regimen for her to take prior to next CT and our [facility] will contact her with new date/time. Please advise on Rx and put in new order to [facility]. Initial call taken by: Lanier Prude, [facility]),  September 16, 2010 8:46 AM  Follow-up for Phone Call        what does the radiologist recommend for pre-treatment? Follow-up by: [name] MD,  September 16, 2010 8:55 AM  Additional Follow-up for Phone Call Additional follow up Details #1::        Pt called wants whatever is going to be given to her be  called into [facility] Pt rs'd CT for 12/28@3 :30pm. Please advise pt. Additional Follow-up by: Verdell Face,  September 16, 2010 8:59 AM    Additional Follow-up for Phone Call Additional follow up Details #2::    Kim Rowland from CT is faxing radiologist standard Rx regimen. Lanier Prude, [facility])  September 16, 2010 9:02 AM   [facility] Pre-med regimen for pts w/iodinated contrast reaction is: 1. prednisone 50mg  OR methylprednisolone 32mg  by mouth at 13 hrs, 7 hours and 1 hour prior to study 2. benadryl 50mg  by mouth at 1 hour prior to study.  Please advise...................Marland KitchenLamar Sprinkles, CMA  September 16, 2010 10:08 AM   Pt informed  Follow-up by: Lamar Sprinkles, CMA,  September 16, 2010 10:29 AM  New/Updated Medications: PREDNISONE 50 MG TABS (PREDNISONE) one tab at 13 hours, 7 hours, and 1 hour prior to CT scan DIPHENHYDRAMINE HCL 50 MG CAPS (DIPHENHYDRAMINE HCL) one dose one hour prior to CT scan Prescriptions: PREDNISONE 50 MG TABS (PREDNISONE) one tab at 13 hours, 7 hours, and 1 hour prior to CT scan  #3 x 1   Entered by:   Lamar Sprinkles, CMA   Authorized by:   [name] MD   Signed by:   Lamar Sprinkles, CMA on 09/16/2010   Method  used:   Electronically to        [facility] [address] [id number]* (retail)       [address] [place], [place]  [address]       Ph: [phone]       Fax: [phone]   RxID:   [id number] DIPHENHYDRAMINE HCL 50 MG CAPS (DIPHENHYDRAMINE HCL) one dose one hour prior to CT scan  #1 x 1   Entered by:   Lamar Sprinkles, CMA   Authorized by:   [name] MD   Signed by:   Lamar Sprinkles, CMA on 09/16/2010   Method used:   Electronically to        [facility] [address] #[id number]* (retail)       [address] [place], [place]  [address]       Ph: [phone]       Fax: [phone]   RxID:   [id number] DIPHENHYDRAMINE HCL 50 MG CAPS (DIPHENHYDRAMINE HCL) one dose one hour prior to CT scan  #1 x 1   Entered and Authorized by:   [name] MD   Signed by:   [name] MD on 09/16/2010   Method used:   Historical   RxID:  [id number] PREDNISONE 50 MG TABS (PREDNISONE) one tab at 13 hours, 7 hours, and 1 hour prior to CT scan  #3 x 1   Entered and Authorized by:   [name] MD   Signed by:   [name] MD on 09/16/2010   Method used:   Historical   RxID:   [id number]

## 2010-10-23 NOTE — Progress Notes (Signed)
  Phone Note Outgoing Call   Summary of Call: Kim Rowland - her CT scan showed a POSSIBLE leak in her aneurysm, please let her know that I have done an updated referral to her surgeon.  TJ Initial call taken by: [name] MD,  September 18, 2010 7:26 AM  Follow-up for Phone Call        pt informed of results. Pt states that she has an appointment with her surgeon today at 11:00am Follow-up by: Brenton Grills CMA [facility]),  September 18, 2010 8:38 AM

## 2010-11-17 ENCOUNTER — Encounter (INDEPENDENT_AMBULATORY_CARE_PROVIDER_SITE_OTHER): Payer: Medicare HMO | Admitting: Surgery

## 2010-11-17 DIAGNOSIS — I712 Thoracic aortic aneurysm, without rupture: Secondary | ICD-10-CM

## 2010-11-18 NOTE — Assessment & Plan Note (Signed)
OFFICE VISIT  Kim Rowland, Kim Rowland DOB:  1946/02/26                                       11/17/2010 WJXBJ#:[id number]  REASON FOR VISIT:  Followup thoracic aneurysm.  HISTORY:  This is a 65 year old female that was seen initially by Dr. Hart Rochester in May of 2011.  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  The patient was referred to Dr. Hart Rochester because of chest discomfort.  There was no evidence of endo leak and the graft was in good position.  The diameter of the aneurysm was 5 cm.  The patient comes back today for continued followup.  She does have an appointment at [facility] in 1 month.  She went to the emergency department and had a CT scan for pain in December.  There was a questionable endo leak.  The aneurysm has not increased in size.  She is currently asymptomatic.  The patient suffers from hypertension, hypercholesterolemia, both of which are medically managed.  However, she has had some recent elevation in her LFTs and she is contemplating cutting back on her cholesterol agent.  She also wants to cut back on her metoprolol.  REVIEW OF SYSTEMS:  VASCULAR:  Positive for history of stroke. CARDIAC:  Positive for shortness of breath with exertion. GI:  Positive for occasional reflux. GU:  Positive for frequent urination. ENT:  Positive for change in eyesight. MUSCULOSKELETAL:  Positive for arthritis. All other review systems are negative.  PAST MEDICAL HISTORY:  Hypertension, hypercholesterolemia, history of thoracic dissection/aneurysm repaired at [facility].  SOCIAL HISTORY:  She is single.  She is unemployed, retired.  She smokes 1 pack every 4 days, does not drink alcohol.  FAMILY HISTORY:  Negative for premature  cardiovascular disease.  PHYSICAL EXAM:  Vital signs:  Heart rate 74, blood pressure 117/64, O2 sat is 98%.  General:  She is well-appearing, in no distress.  HEENT: Within normal limits.  Lungs:  Clear bilaterally.  Cardiovascular: Regular rate and rhythm.  No carotid bruits.  Palpable pedal pulses. Abdomen:  Soft, nontender.  Musculoskeletal:  No major deformities. Neurological:  No focal deficits.  Skin:  Without rash.  ASSESSMENT AND PLAN:  Thoracic aneurysm status post stenting at [facility] in 2007.  I diagrammed out today the stent in her aneurysm as well as showed the area of concern on her CT scan.  It is unclear from the scan whether or not this is an endo leak.  My suspicion is that it is not.  However, the scan was a PE protocol scan and does not have any pre or post contrast images to make a full evaluation.  Regardless her aneurysm is not increasing in size.  Therefore I do not feel like any intervention is warranted.  The patient is scheduled to go see her physicians at [facility] next month.  They are going to fully evaluate her.  She discussed getting her care for her aneurysm at [facility] unless something acute were to happen.  I was in agreement with this.  She is going to follow up with me on a p.r.n. basis should any issues arise.  Otherwise I think  she is doing very well and stable at this time.    Jorge Ny, MD Electronically Signed  VWB/MEDQ  D:  11/17/2010  T:  11/18/2010  Job:  [id number]  cc:   Dr Ardelle Park

## 2010-12-25 LAB — CREATININE, SERUM: GFR calc Af Amer: >60 mL/min (ref >60)

## 2010-12-25 LAB — BUN: BUN: 16 mg/dL (ref 6–23)

## 2011-01-07 ENCOUNTER — Telehealth: Payer: Self-pay | Admitting: *Deleted

## 2011-01-07 MED ORDER — AZITHROMYCIN 500 MG PO TABS: 500.0000 mg | ORAL_TABLET | Freq: Every day | ORAL | Status: DC

## 2011-01-07 NOTE — Telephone Encounter (Signed)
Pharmacy  needed.

## 2011-01-07 NOTE — Telephone Encounter (Signed)
Patient requesting rx from MD for Zpak - says if she does not get abx it will turn into "bronchitis". Pt c/o sinus drainage/congestion and productive cough with white mucus. No fever, chills or body aches. She is taking otc coricidin w/some relief. Please advise.

## 2011-01-08 MED ORDER — AZITHROMYCIN 500 MG PO TABS: 500.0000 mg | ORAL_TABLET | Freq: Every day | ORAL | Status: AC

## 2011-01-08 NOTE — Telephone Encounter (Signed)
Patient notified and rx sent in 

## 2011-01-15 ENCOUNTER — Other Ambulatory Visit: Payer: Self-pay | Admitting: Internal Medicine

## 2011-01-15 DIAGNOSIS — Z1231 Encounter for screening mammogram for malignant neoplasm of breast: Secondary | ICD-10-CM

## 2011-02-03 NOTE — Consult Note (Signed)
NEW PATIENT CONSULTATION   Kim Rowland, Kim Rowland  DOB:  09/25/1945                                       01/27/2010  JXBJY#:[id number]   The patient was referred by Dr. Ardelle Park for followup regarding a thoracic  aortic stent graft placed at [facility] in 2007 for an  apparent enlarging thoracic aneurysm following an aortic dissection  several years earlier.  The aneurysm enlarged to a size that required  stent grafting and she had 2 thoracic aortic stent grafts placed in  2007.  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  She has been evaluated by neurosurgeons and recommendation for  surgery has been made, but she now is getting periodic injections at  [facility].  Recently she had some right chest discomfort,  went to urgent care.  The chest discomfort resolved.  Cardiac problems  were ruled out and she was referred here for continued followup of her  thoracic aortic stent graft.  Her last CT angiogram performed here was  done at [facility] on June 25, 2009.  Today I reviewed that  CT angiogram of the chest and abdomen.  It reveals the stent graft to be  in excellent position from just distal to the left subclavian to the  diaphragm with no evidence of endoleak or migration, with a maximum  diameter noted to be 5 cm in the mid descending thoracic aorta.   CHRONIC MEDICAL PROBLEMS:  1. Left cerebellar stroke - asymptomatic.  2. Hypertension.  3. Hyperlipidemia.  4. Diabetes, borderline.   Negative for coronary artery disease or COPD.   FAMILY HISTORY:  Positive for stroke in her mother.  Negative for  coronary artery disease or diabetes.   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   She smokes half a  pack of cigarettes per day, has done so for 40+ years.  Does not use  alcohol.   REVIEW OF SYSTEMS:  Positive for occasional chest discomfort, dyspnea on  exertion, chronic bronchitis, pain in the right leg with ambulation,  dizziness, headaches, arthritis, joint pain, muscle pain, change in  vision.  All other systems in the review of systems are negative.   PHYSICAL EXAMINATION:  Blood pressure 131/68, heart rate 79,  respirations 14.  Generally, she is a well-developed, well-nourished  female in no apparent distress, alert and oriented x3.  HEENT exam is  normal.  EOMs intact.  Chest:  Clear to auscultation.  No rales or  rhonchi heard.  Cardiovascular exam is a regular rhythm.  No murmurs.  Carotid pulses are 3+.  No audible bruits.  Abdomen is soft, nontender,  with no masses.  Musculoskeletal exam is free of major deformities.  Neurologic exam is normal.  Skin is free of rashes.  Lower extremity  exam reveals 3+ femoral and dorsalis pedis pulses bilaterally with no  distal edema.   CT angiogram of her thoracic stent graft does appear to be stable.  We  will follow her on an annual basis with CT angiograms of the chest and  abdomen.  She will return in 6-8 months to be seen by Dr. Myra Gianotti with a  CT angiogram at the time of return.  If she develops new symptoms of  chest discomfort, then she will be in touch with [place].     Quita Skye Hart Rochester, M.D.  Electronically Signed   JDL/MEDQ  D:  01/27/2010  T:  01/28/2010  Job:  [id number]   cc:   Dr. Ardelle Park

## 2011-02-17 ENCOUNTER — Ambulatory Visit (HOSPITAL_COMMUNITY)
Admission: RE | Admit: 2011-02-17 | Discharge: 2011-02-17 | Disposition: A | Discharge status: Home / Self Care | Payer: Medicare HMO | Source: Ambulatory Visit | Attending: Internal Medicine | Admitting: Internal Medicine

## 2011-02-17 ENCOUNTER — Encounter: Payer: Self-pay | Admitting: Internal Medicine

## 2011-02-17 DIAGNOSIS — Z1231 Encounter for screening mammogram for malignant neoplasm of breast: Secondary | ICD-10-CM | POA: Insufficient documentation

## 2011-02-18 ENCOUNTER — Encounter: Payer: Self-pay | Admitting: Internal Medicine

## 2011-02-18 ENCOUNTER — Ambulatory Visit (INDEPENDENT_AMBULATORY_CARE_PROVIDER_SITE_OTHER): Payer: Medicare HMO | Admitting: Internal Medicine

## 2011-02-18 ENCOUNTER — Other Ambulatory Visit (INDEPENDENT_AMBULATORY_CARE_PROVIDER_SITE_OTHER): Payer: Medicare HMO

## 2011-02-18 ENCOUNTER — Ambulatory Visit (INDEPENDENT_AMBULATORY_CARE_PROVIDER_SITE_OTHER)
Admission: RE | Admit: 2011-02-18 | Discharge: 2011-02-18 | Disposition: A | Discharge status: Home / Self Care | Payer: Medicare HMO | Source: Ambulatory Visit | Attending: Internal Medicine | Admitting: Internal Medicine

## 2011-02-18 VITALS — BP 112/64 | HR 64 | Temp 98.3°F | Resp 16 | Wt 159.0 lb

## 2011-02-18 DIAGNOSIS — I714 Abdominal aortic aneurysm, without rupture: Secondary | ICD-10-CM

## 2011-02-18 DIAGNOSIS — R7309 Other abnormal glucose: Secondary | ICD-10-CM

## 2011-02-18 DIAGNOSIS — R05 Cough: Secondary | ICD-10-CM

## 2011-02-18 DIAGNOSIS — F17200 Nicotine dependence, unspecified, uncomplicated: Secondary | ICD-10-CM

## 2011-02-18 DIAGNOSIS — E785 Hyperlipidemia, unspecified: Secondary | ICD-10-CM

## 2011-02-18 DIAGNOSIS — R059 Cough, unspecified: Secondary | ICD-10-CM | POA: Insufficient documentation

## 2011-02-18 DIAGNOSIS — I10 Essential (primary) hypertension: Secondary | ICD-10-CM

## 2011-02-18 LAB — COMPREHENSIVE METABOLIC PANEL
ALT: 23 U/L (ref 0–35)
CO2: 27 mEq/L (ref 19–32)
Calcium: 9.4 mg/dL (ref 8.4–10.5)
Creatinine, Ser: 0.7 mg/dL (ref 0.4–1.2)
GFR: 116.5 mL/min (ref >60.00)
Glucose, Bld: 85 mg/dL (ref 70–99)
Sodium: 140 mEq/L (ref 135–145)

## 2011-02-18 LAB — CBC WITH DIFFERENTIAL/PLATELET
HCT: 35.9 % — ABNORMAL LOW (ref 36.0–46.0)
Lymphocytes Relative: 32.2 % (ref 12.0–46.0)
MCHC: 34.1 g/dL (ref 30.0–36.0)
MCV: 87.1 fl (ref 78.0–100.0)
Monocytes Relative: 9.3 % (ref 3.0–12.0)
Neutro Abs: 3.4 10*3/uL (ref 1.4–7.7)
RBC: 4.12 Mil/uL (ref 3.87–5.11)
RDW: 13.3 % (ref 11.5–14.6)

## 2011-02-18 LAB — TSH: TSH: 2.39 u[IU]/mL (ref 0.35–5.50)

## 2011-02-18 MED ORDER — OLMESARTAN MEDOXOMIL 20 MG PO TABS: 20.0000 mg | ORAL_TABLET | Freq: Every day | ORAL | Status: DC

## 2011-02-18 NOTE — Assessment & Plan Note (Signed)
This is most consistent with fatty liver disease, I will monitor her liver enzymes today

## 2011-02-18 NOTE — Progress Notes (Signed)
Subjective:    Patient ID: Kim Rowland, female    DOB: 08-03-1946, 65 y.o.   MRN: [id number]  Hypertension This is a chronic problem. The current episode started more than 1 year ago. The problem has been gradually improving since onset. The problem is controlled. Associated symptoms include peripheral edema (she has had some edema lately but there is no edema today). Pertinent negatives include no anxiety, blurred vision, chest pain, headaches, malaise/fatigue, neck pain, orthopnea, palpitations, PND, shortness of breath or sweats. There are no associated agents to hypertension. Past treatments include calcium channel blockers and beta blockers. The current treatment provides significant improvement. Compliance problems include exercise.     Also, she tells me that she had a cough and URI symptoms 2 weeks ago but all of those symptoms resolved after a course of Zpak. Today she wants a cxr done to make sure she does not have PNA.    Review of Systems  Constitutional: Negative for fever, chills, malaise/fatigue, diaphoresis, activity change, appetite change, fatigue and unexpected weight change.  HENT: Negative for sore throat, facial swelling, trouble swallowing, neck pain, neck stiffness and voice change.   Eyes: Negative for blurred vision.  Respiratory: Negative for cough, chest tightness, shortness of breath, wheezing and stridor.   Cardiovascular: Negative for chest pain, palpitations, orthopnea, leg swelling and PND.  Gastrointestinal: Negative for nausea, vomiting, abdominal pain, diarrhea, constipation and blood in stool.  Genitourinary: Negative for dysuria, urgency, frequency, hematuria, enuresis and difficulty urinating.  Musculoskeletal: Negative for myalgias, joint swelling, arthralgias and gait problem.  Skin: Negative for color change, pallor and rash.  Neurological: Negative for dizziness, tremors, seizures, syncope, facial asymmetry, speech difficulty, weakness,  light-headedness, numbness and headaches.  Hematological: Negative for adenopathy. Does not bruise/bleed easily.  Psychiatric/Behavioral: Negative.        Objective:   Physical Exam  Vitals reviewed. Constitutional: She is oriented to person, place, and time. She appears well-developed and well-nourished. No distress.  HENT:  Head: Normocephalic and atraumatic.  Right Ear: External ear normal.  Left Ear: External ear normal.  Nose: Nose normal.  Mouth/Throat: Oropharynx is clear and moist. No oropharyngeal exudate.  Eyes: Conjunctivae and EOM are normal. Pupils are equal, round, and reactive to light. Right eye exhibits no discharge. Left eye exhibits no discharge. No scleral icterus.  Neck: Normal range of motion. Neck supple. No JVD present. No tracheal deviation present. No thyromegaly present.  Cardiovascular: Normal rate, regular rhythm, normal heart sounds and intact distal pulses.  Exam reveals no gallop and no friction rub.   No murmur heard. Pulmonary/Chest: Effort normal and breath sounds normal. No stridor. No respiratory distress. She has no wheezes. She has no rales. She exhibits no tenderness.  Abdominal: Soft. Bowel sounds are normal. She exhibits no distension and no mass. There is no tenderness. There is no rebound and no guarding.  Musculoskeletal: Normal range of motion. She exhibits no edema and no tenderness.  Lymphadenopathy:    She has no cervical adenopathy.  Neurological: She is alert and oriented to person, place, and time. No cranial nerve deficit. Coordination normal.  Skin: Skin is warm and dry. No rash noted. She is not diaphoretic. No erythema. No pallor.  Psychiatric: She has a normal mood and affect. Her behavior is normal. Judgment and thought content normal.        Lab Results  Component Value Date   WBC 5.4 09/08/2010   HGB 13.1 09/08/2010   HCT 38.0 09/08/2010   PLT 361.0  09/08/2010   CHOL 172 09/08/2010   TRIG 192.0* 09/08/2010   HDL 49.70  09/08/2010   ALT 17 09/18/2010   AST 19 09/18/2010   NA 143 09/08/2010   K 3.6 09/08/2010   CL 105 09/08/2010   CREATININE 0.7 09/08/2010   BUN 11 09/08/2010   CO2 29 09/08/2010   TSH 2.07 09/08/2010   HGBA1C 5.8 09/08/2010    Assessment & Plan:

## 2011-02-18 NOTE — Assessment & Plan Note (Signed)
Her BP is well controlled but she has side effects from the amlodipine so I will change to benicar and follow her BP closely

## 2011-02-18 NOTE — Assessment & Plan Note (Signed)
She is not tolerating the amlodipine so I will change to Benicar

## 2011-02-18 NOTE — Assessment & Plan Note (Signed)
She is doing well on Vytorin

## 2011-02-18 NOTE — Assessment & Plan Note (Signed)
All of her s/s have resolved, today I will check a cxr

## 2011-02-18 NOTE — Patient Instructions (Signed)

## 2011-02-18 NOTE — Assessment & Plan Note (Signed)
I will check her A1C and BS today

## 2011-02-18 NOTE — Assessment & Plan Note (Signed)
She has quit smoking and I praised her for this

## 2011-02-18 NOTE — Assessment & Plan Note (Signed)
She tells me that her surgeon in [place] told her that the AAA was shrinking and she has no s/s today

## 2011-02-26 ENCOUNTER — Ambulatory Visit (HOSPITAL_COMMUNITY): Payer: Medicare HMO

## 2011-03-08 ENCOUNTER — Emergency Department (HOSPITAL_COMMUNITY): Payer: Medicare HMO

## 2011-03-08 ENCOUNTER — Emergency Department (HOSPITAL_COMMUNITY)
Admission: EM | Admit: 2011-03-08 | Discharge: 2011-03-08 | Disposition: A | Discharge status: Home / Self Care | Payer: Medicare HMO | Attending: Emergency Medicine | Admitting: Emergency Medicine

## 2011-03-08 DIAGNOSIS — I10 Essential (primary) hypertension: Secondary | ICD-10-CM | POA: Insufficient documentation

## 2011-03-08 DIAGNOSIS — W172XXA Fall into hole, initial encounter: Secondary | ICD-10-CM | POA: Insufficient documentation

## 2011-03-08 DIAGNOSIS — IMO0002 Reserved for concepts with insufficient information to code with codable children: Secondary | ICD-10-CM | POA: Insufficient documentation

## 2011-03-08 DIAGNOSIS — S93409A Sprain of unspecified ligament of unspecified ankle, initial encounter: Secondary | ICD-10-CM | POA: Insufficient documentation

## 2011-03-08 DIAGNOSIS — M25579 Pain in unspecified ankle and joints of unspecified foot: Secondary | ICD-10-CM | POA: Insufficient documentation

## 2011-03-08 DIAGNOSIS — I2510 Atherosclerotic heart disease of native coronary artery without angina pectoris: Secondary | ICD-10-CM | POA: Insufficient documentation

## 2011-03-12 ENCOUNTER — Encounter: Payer: Self-pay | Admitting: Internal Medicine

## 2011-03-12 ENCOUNTER — Ambulatory Visit (INDEPENDENT_AMBULATORY_CARE_PROVIDER_SITE_OTHER): Payer: Medicare HMO | Admitting: Internal Medicine

## 2011-03-12 VITALS — BP 172/94 | HR 67 | Temp 98.4°F | Resp 14 | Wt 162.2 lb

## 2011-03-12 DIAGNOSIS — M2420 Disorder of ligament, unspecified site: Secondary | ICD-10-CM

## 2011-03-12 DIAGNOSIS — M24273 Disorder of ligament, unspecified ankle: Secondary | ICD-10-CM | POA: Insufficient documentation

## 2011-03-12 DIAGNOSIS — I10 Essential (primary) hypertension: Secondary | ICD-10-CM

## 2011-03-12 MED ORDER — OLMESARTAN MEDOXOMIL-HCTZ 40-12.5 MG PO TABS: 1.0000 | ORAL_TABLET | Freq: Every day | ORAL | Status: DC

## 2011-03-12 NOTE — Patient Instructions (Signed)

## 2011-03-12 NOTE — Progress Notes (Signed)
  Subjective:    Patient ID: Kim Rowland, female    DOB: 08/06/1946, 65 y.o.   MRN: [id number]  HPI She returns for f/up and tells me that she hurt her left ankle about 3 weeks ago and was seen in the ER and was told that she had "torn ligaments" in her right ankle, she was placed in an ankle immobilizer and she is using crutches - she has too much pain to bear weight on the ankle. She wants an ortho referral.  When she was in the ER she was told that her BP was 198/78. She has monitored her BP since then and it remains high, consistently above 140/90.    Review of Systems  Constitutional: Negative.   HENT: Negative.   Eyes: Negative.   Respiratory: Negative for cough, chest tightness, shortness of breath, wheezing and stridor.   Cardiovascular: Negative for chest pain, palpitations and leg swelling.  Gastrointestinal: Negative for nausea, vomiting, abdominal pain, diarrhea and constipation.  Genitourinary: Negative.   Musculoskeletal: Positive for arthralgias (right ankle pain). Negative for myalgias, back pain, joint swelling and gait problem.  Skin: Negative for color change, pallor and rash.  Neurological: Negative.   Hematological: Negative.   Psychiatric/Behavioral: Negative.        Objective:   Physical Exam  Vitals reviewed. Constitutional: She is oriented to person, place, and time. She appears well-developed and well-nourished. No distress.  HENT:  Head: Normocephalic and atraumatic.  Right Ear: External ear normal.  Left Ear: External ear normal.  Nose: Nose normal.  Mouth/Throat: Oropharynx is clear and moist. No oropharyngeal exudate.  Eyes: Conjunctivae and EOM are normal. Pupils are equal, round, and reactive to light. Right eye exhibits no discharge. Left eye exhibits no discharge. No scleral icterus.  Neck: Normal range of motion. Neck supple. No JVD present. No tracheal deviation present. No thyromegaly present.  Cardiovascular: Normal rate, regular rhythm,  normal heart sounds and intact distal pulses.  Exam reveals no gallop and no friction rub.   No murmur heard. Pulmonary/Chest: Effort normal and breath sounds normal. No stridor. No respiratory distress. She has no wheezes. She has no rales. She exhibits no tenderness.  Abdominal: Soft. Bowel sounds are normal. She exhibits no distension and no mass. There is no tenderness. There is no rebound and no guarding.  Musculoskeletal: Normal range of motion. She exhibits no edema and no tenderness.  Lymphadenopathy:    She has no cervical adenopathy.  Neurological: She is alert and oriented to person, place, and time. She has normal reflexes. She displays normal reflexes. No cranial nerve deficit. She exhibits normal muscle tone. Coordination normal.  Skin: Skin is warm and dry. No rash noted. She is not diaphoretic. No erythema. No pallor.  Psychiatric: She has a normal mood and affect. Her behavior is normal. Judgment and thought content normal.        Lab Results  Component Value Date   WBC 6.3 02/18/2011   HGB 12.2 02/18/2011   HCT 35.9* 02/18/2011   PLT 376.0 02/18/2011   CHOL 172 09/08/2010   TRIG 192.0* 09/08/2010   HDL 49.70 09/08/2010   ALT 23 02/18/2011   AST 22 02/18/2011   NA 140 02/18/2011   K 3.5 02/18/2011   CL 104 02/18/2011   CREATININE 0.7 02/18/2011   BUN 9 02/18/2011   CO2 27 02/18/2011   TSH 2.39 02/18/2011   HGBA1C 5.7 02/18/2011    Assessment & Plan:

## 2011-03-12 NOTE — Assessment & Plan Note (Signed)
Change benicar to benicar-hct for better BP control

## 2011-03-12 NOTE — Assessment & Plan Note (Signed)
Ortho referral  

## 2011-03-16 ENCOUNTER — Telehealth: Payer: Self-pay

## 2011-03-16 ENCOUNTER — Ambulatory Visit (INDEPENDENT_AMBULATORY_CARE_PROVIDER_SITE_OTHER): Payer: Medicare HMO | Admitting: Internal Medicine

## 2011-03-16 ENCOUNTER — Encounter: Payer: Self-pay | Admitting: Internal Medicine

## 2011-03-16 VITALS — BP 152/78 | HR 60 | Temp 98.7°F | Ht 66.0 in | Wt 158.2 lb

## 2011-03-16 DIAGNOSIS — I10 Essential (primary) hypertension: Secondary | ICD-10-CM

## 2011-03-16 MED ORDER — TRAMADOL HCL 50 MG PO TABS: 50.0000 mg | ORAL_TABLET | Freq: Four times a day (QID) | ORAL | Status: DC

## 2011-03-16 NOTE — Progress Notes (Signed)
Addended by: Rene Paci A on: 03/16/2011 03:23 PM   Modules accepted: Orders

## 2011-03-16 NOTE — Assessment & Plan Note (Signed)
Stopped amlodipine due to edema - but BP uncontrolled Resume 1/2 dose (5mg  qd) and continue new ARB/hct with bbloc follow up 2 weeks, call if problems or SBP>140

## 2011-03-16 NOTE — Progress Notes (Signed)
  Subjective:    Patient ID: Kim Rowland, female    DOB: Aug 31, 1946, 65 y.o.   MRN: [id number]  HPI Here for HTN follow up  Seen last week by PCP - and noted edema with amlodipine so changed to ARB/hct given Benicar samples for tx same - no change home BP readings (reviewed home log today)  Past Medical History  Diagnosis Date  . Stroke     left cerebellar- asymptomatic  . Hypertension   . Hyperlipidemia   . Diabetes mellitus     borderline  . Thoracic aneurysm     repaired at [facility] in 2008  . Hx of colonic polyps   . COPD (chronic obstructive pulmonary disease)   . Diverticulitis   . GERD (gastroesophageal reflux disease)   . Cerebrovascular accident   . Chronic insomnia      Review of Systems  Respiratory: Negative for shortness of breath.   Cardiovascular: Negative for chest pain.  Neurological: Negative for headaches.       Objective:   Physical Exam BP 152/78  Pulse 60  Temp(Src) 98.7 F (37.1 C) (Oral)  Ht 5\' 6"  (1.676 m)  Wt 158 lb 3.2 oz (71.759 kg)  BMI 25.53 kg/m2  SpO2 97% Physical Exam  Constitutional: She is oriented to person, place, and time. She appears well-developed and well-nourished. No distress.  Neck: Normal range of motion. Neck supple. No JVD present. No thyromegaly present.  Cardiovascular: Normal rate, regular rhythm and normal heart sounds.  No murmur heard. No BLE edema. Pulmonary/Chest: Effort normal and breath sounds normal. No respiratory distress. She has no wheezes.  Neurological: She is alert and oriented to person, place, and time. No cranial nerve deficit. Coordination normal.  Psychiatric: She has a normal mood and affect. Her behavior is normal. Judgment and thought content normal.      Lab Results  Component Value Date   WBC 6.3 02/18/2011   HGB 12.2 02/18/2011   HCT 35.9* 02/18/2011   PLT 376.0 02/18/2011   CHOL 172 09/08/2010   TRIG 192.0* 09/08/2010   HDL 49.70 09/08/2010   ALT 23 02/18/2011   AST 22  02/18/2011   NA 140 02/18/2011   K 3.5 02/18/2011   CL 104 02/18/2011   CREATININE 0.7 02/18/2011   BUN 9 02/18/2011   CO2 27 02/18/2011   TSH 2.39 02/18/2011   HGBA1C 5.7 02/18/2011    Assessment & Plan:  See problem list. Medications and labs reviewed today.

## 2011-03-16 NOTE — Patient Instructions (Signed)
It was good to see you today. Resume 1/2 tab of amlodipine (5mg ) with other medications - toprol and benicar hct Call if SBP>140s or other problems Ok to see Dr. Danielle Dess for injection as needed Please schedule followup in  2 weeks with dr. Yetta Barre, call sooner if problems.

## 2011-03-16 NOTE — Progress Notes (Signed)
  Subjective:    Patient ID: Gracelyn Nurse, female    DOB: 1946-07-20, 65 y.o.   MRN: [id number]  HPI    Review of Systems     Objective:   Physical Exam        Assessment & Plan:  Pt also requests refill on tramadol - erx done

## 2011-03-16 NOTE — Telephone Encounter (Signed)
Noted. Agree w/a f/up Thx

## 2011-03-16 NOTE — Telephone Encounter (Signed)
[facility] Triage Call Report Triage Record Num: [id number] Operator: Hillary Bow Patient Name: Kim Rowland Nurse Call Date & Time: 03/15/2011 2:55:52PM Patient Phone: [phone] PCP: [place] Patient Gender: Female PCP Fax : [phone] Patient DOB: 03-Oct-1945 Practice Name: [facility] Reason for Call: Pt calling about having elevated BP, onset 6-21 after sprain ankle. BP 174/84 to 157/71 since med switch. Pt BP on 03-15-11 has been 175/70 and 170/82. Pt was switched from Norvasc to Benicar d/t ankle swelling (sprain ankle) on 03-12-11. Pt takes Benicar 40mg . Dr Yetta Barre advised Pt to monitor BP d/t HX of AAA. Pt has Norvasc 10mg  at home. Consulted w/ Dr Tawanna Cooler, take Norvasc now and take another Norvasc on 03-16-11, Pt should f/u w/ Dr Yetta Barre on 03-16-11. Advised Pt to monitor BP 1 to 2 hrs after taking Norvasc and c/b if continues to elevate. Pt verbalized understanding. Home care advice given. Protocol(s) Used: Hypertension, Diagnosed or Suspected Recommended Outcome per Protocol: See Provider within 72 Hours Reason for Outcome: Multiple elevated blood pressure readings without other symptoms AND no previous work-up OR readings exceed expected range defined by treatment plan Care Advice: ~ 03/15/2011 3:32:09PM Page 1 of 1 [email or web address]

## 2011-03-24 ENCOUNTER — Ambulatory Visit: Payer: Medicare HMO | Admitting: Physical Therapy

## 2011-03-27 ENCOUNTER — Ambulatory Visit (INDEPENDENT_AMBULATORY_CARE_PROVIDER_SITE_OTHER): Payer: Medicare HMO | Admitting: Internal Medicine

## 2011-03-27 DIAGNOSIS — I10 Essential (primary) hypertension: Secondary | ICD-10-CM

## 2011-03-28 NOTE — Progress Notes (Signed)
  Subjective:    Patient ID: Gracelyn Nurse, female    DOB: Oct 07, 1945, 65 y.o.   MRN: [id number]  HPI  She was not seen  Review of Systems     Objective:   Physical Exam        Assessment & Plan:

## 2011-03-30 ENCOUNTER — Ambulatory Visit (INDEPENDENT_AMBULATORY_CARE_PROVIDER_SITE_OTHER): Payer: Medicare HMO | Admitting: Internal Medicine

## 2011-03-30 ENCOUNTER — Encounter: Payer: Self-pay | Admitting: Internal Medicine

## 2011-03-30 DIAGNOSIS — M545 Low back pain: Secondary | ICD-10-CM

## 2011-03-30 DIAGNOSIS — M79605 Pain in left leg: Secondary | ICD-10-CM

## 2011-03-30 DIAGNOSIS — G4700 Insomnia, unspecified: Secondary | ICD-10-CM

## 2011-03-30 DIAGNOSIS — I10 Essential (primary) hypertension: Secondary | ICD-10-CM

## 2011-03-30 MED ORDER — TRAMADOL HCL 50 MG PO TABS: 50.0000 mg | ORAL_TABLET | Freq: Four times a day (QID) | ORAL | Status: DC

## 2011-03-30 MED ORDER — ESZOPICLONE 2 MG PO TABS: 2.0000 mg | ORAL_TABLET | Freq: Every evening | ORAL | Status: DC | PRN

## 2011-03-30 NOTE — Patient Instructions (Addendum)
Hypertension (High Blood Pressure) As your heart beats, it forces blood through your arteries. This force is your blood pressure. If the pressure is too high, it is called hypertension (HTN) or high blood pressure. HTN is dangerous because you may have it and not know it. High blood pressure may mean that your heart has to work harder to pump blood. Your arteries may be narrow or stiff. The extra work puts you at risk for heart disease, stroke, and other problems.  Blood pressure consists of two numbers, a higher number over a lower, 110/72, for example. It is stated as "110 over 72." The ideal is below 120 for the top number (systolic) and under 80 for the bottom (diastolic). Write down your blood pressure today. You should pay close attention to your blood pressure if you have certain conditions such as:  Heart failure.  Prior heart attack.   Diabetes   Chronic kidney disease.   Prior stroke.   Multiple risk factors for heart disease.   To see if you have HTN, your blood pressure should be measured while you are seated with your arm held at the level of the heart. It should be measured at least twice. A one-time elevated blood pressure reading (especially in the Emergency Department) does not mean that you need treatment. There may be conditions in which the blood pressure is different between your right and left arms. It is important to see your caregiver soon for a recheck. Most people have essential hypertension which means that there is not a specific cause. This type of high blood pressure may be lowered by changing lifestyle factors such as:  Stress.  Smoking.   Lack of exercise.   Excessive weight.  Drug/tobacco/alcohol use.   Eating less salt.   Most people do not have symptoms from high blood pressure until it has caused damage to the body. Effective treatment can often prevent, delay or reduce that damage. TREATMENT Treatment for high blood pressure, when a cause has been  identified, is directed at the cause. There are a large number of medications to treat HTN. These fall into several categories, and your caregiver will help you select the medicines that are best for you. Medications may have side effects. You should review side effects with your caregiver. If your blood pressure stays high after you have made lifestyle changes or started on medicines,   Your medication(s) may need to be changed.   Other problems may need to be addressed.   Be certain you understand your prescriptions, and know how and when to take your medicine.   Be sure to follow up with your caregiver within the time frame advised (usually within two weeks) to have your blood pressure rechecked and to review your medications.   If you are taking more than one medicine to lower your blood pressure, make sure you know how and at what times they should be taken. Taking two medicines at the same time can result in blood pressure that is too low.  SEEK IMMEDIATE MEDICAL CARE IF YOU DEVELOP:  A severe headache, blurred or changing vision, or confusion.   Unusual weakness or numbness, or a faint feeling.   Severe chest or abdominal pain, vomiting, or breathing problems.  MAKE SURE YOU:   Understand these instructions.   Will watch your condition.   Will get help right away if you are not doing well or get worse.  Document Released: 09/07/2005 Document Re-Released: 02/25/2010 [employer] Patient Information 2011 [employer].Insomnia Insomnia is frequent trouble falling and/or staying asleep. Insomnia can be a long term problem or a short term problem. Both are common. Insomnia can be a short term problem when the wakefulness is related to a certain stress or worry. Long term insomnia is often related to ongoing stress during waking hours and/or poor sleeping habits. Overtime, sleep deprivation itself can make the problem worse. Every little thing feels more severe because you are overtired  and your ability to cope is decreased. SYMPTOMS  Not feeling rested in the morning.   Anxiety and restlessness at bedtime.   Difficulty falling and staying asleep.  CAUSES  Stress, anxiety, and depression.   Poor sleeping habits.   Distractions such as TV in the bedroom.   Naps close to bedtime.   Engaging in emotionally charged conversations before bed.   Technical reading before sleep.   Alcohol and other sedatives. They may make the problem worse. They can hurt normal sleep patterns and normal dream activity.   Stimulants such as caffeine for several hours prior to bedtime.   Pain syndromes and shortness of breath can cause insomnia.   Exercise late at night.   Changing time zones may cause sleeping problems (jet lag).  It is sometimes helpful to have someone observe your sleeping patterns. They should look for periods of not breathing during the night (sleep apnea). They should also look to see how long those periods last. If you live alone or observers are uncertain, you can also be observed at a sleep clinic where your sleep patterns will be professionally monitored. Sleep apnea requires a checkup and treatment. Give your caregivers your medical history. Give your caregivers observations your family has made about your sleep.  TREATMENT  Your caregiver may prescribe treatment for an underlying medical disorders. Your caregiver can give advice or help if you are using alcohol or other drugs for self-medication. Treatment of underlying problems will usually eliminate insomnia problems.   Medications can be prescribed for short time use. They are generally not recommended for lengthy use.   Over-the-counter sleep medicines are not recommended for lengthy use. They can be habit forming.   You can promote easier sleeping by making lifestyle changes such as:   Using relaxation techniques that help with breathing and reduce muscle tension.   Exercising earlier in the day.     Changing your diet and the time of your last meal. No night time snacks.   Establish a regular time to go to bed.   Counseling can help with stressful problems and worry.   Soothing music and white noise may be helpful if there are background noises you cannot remove.   Stop tedious detailed work at least one hour before bedtime.  HOME CARE INSTRUCTIONS  Keep a diary. Inform your caregiver about your progress. This includes any medication side effects. See your caregiver regularly. Take note of:   Times when you are asleep.  Times when you are awake during the night.   The quality of your sleep.  How you feel the next day.   This information will help your caregiver care for you.  Get out of bed if you are still awake after 15 minutes. Read or do some quiet activity. Keep the lights down. Wait until you feel sleepy and go back to bed.   Keep regular sleeping and waking hours. Avoid naps.   Exercise regularly.   Avoid distractions at bedtime. Distractions include watching television or engaging in any intense  or detailed activity like attempting to balance the household checkbook.   Develop a bedtime ritual. Keep a familiar routine of bathing, brushing your teeth, climbing into bed at the same time each night, listening to soothing music. Routines increase the success of falling to sleep faster.   Use relaxation techniques. This can be using breathing and muscle tension release routines. It can also include visualizing peaceful scenes. You can also help control troubling or intruding thoughts by keeping your mind occupied with boring or repetitive thoughts like the old concept of counting sheep. You can make it more creative like imagining planting one beautiful flower after another in your backyard garden.   During your day, work to eliminate stress. When this is not possible use some of the previous suggestions to help reduce the anxiety that accompanies stressful situations.   MAKE SURE YOU:   Understand these instructions.   Will watch your condition.   Will get help right away if you are not doing well or get worse.  Document Released: 09/04/2000 Document Re-Released: 08/20/2008 [facility] Patient Information 2011 [place], [place].Insomnia Insomnia is frequent trouble falling and/or staying asleep. Insomnia can be a long term problem or a short term problem. Both are common. Insomnia can be a short term problem when the wakefulness is related to a certain stress or worry. Long term insomnia is often related to ongoing stress during waking hours and/or poor sleeping habits. Overtime, sleep deprivation itself can make the problem worse. Every little thing feels more severe because you are overtired and your ability to cope is decreased. SYMPTOMS  Not feeling rested in the morning.   Anxiety and restlessness at bedtime.   Difficulty falling and staying asleep.  CAUSES  Stress, anxiety, and depression.   Poor sleeping habits.   Distractions such as TV in the bedroom.   Naps close to bedtime.   Engaging in emotionally charged conversations before bed.   Technical reading before sleep.   Alcohol and other sedatives. They may make the problem worse. They can hurt normal sleep patterns and normal dream activity.   Stimulants such as caffeine for several hours prior to bedtime.   Pain syndromes and shortness of breath can cause insomnia.   Exercise late at night.   Changing time zones may cause sleeping problems (jet lag).  It is sometimes helpful to have someone observe your sleeping patterns. They should look for periods of not breathing during the night (sleep apnea). They should also look to see how long those periods last. If you live alone or observers are uncertain, you can also be observed at a sleep clinic where your sleep patterns will be professionally monitored. Sleep apnea requires a checkup and treatment. Give your caregivers your medical  history. Give your caregivers observations your family has made about your sleep.  TREATMENT  Your caregiver may prescribe treatment for an underlying medical disorders. Your caregiver can give advice or help if you are using alcohol or other drugs for self-medication. Treatment of underlying problems will usually eliminate insomnia problems.   Medications can be prescribed for short time use. They are generally not recommended for lengthy use.   Over-the-counter sleep medicines are not recommended for lengthy use. They can be habit forming.   You can promote easier sleeping by making lifestyle changes such as:   Using relaxation techniques that help with breathing and reduce muscle tension.   Exercising earlier in the day.   Changing your diet and the time of your last meal.  No night time snacks.   Establish a regular time to go to bed.   Counseling can help with stressful problems and worry.   Soothing music and white noise may be helpful if there are background noises you cannot remove.   Stop tedious detailed work at least one hour before bedtime.  HOME CARE INSTRUCTIONS  Keep a diary. Inform your caregiver about your progress. This includes any medication side effects. See your caregiver regularly. Take note of:   Times when you are asleep.  Times when you are awake during the night.   The quality of your sleep.  How you feel the next day.   This information will help your caregiver care for you.  Get out of bed if you are still awake after 15 minutes. Read or do some quiet activity. Keep the lights down. Wait until you feel sleepy and go back to bed.   Keep regular sleeping and waking hours. Avoid naps.   Exercise regularly.   Avoid distractions at bedtime. Distractions include watching television or engaging in any intense or detailed activity like attempting to balance the household checkbook.   Develop a bedtime ritual. Keep a familiar routine of bathing, brushing  your teeth, climbing into bed at the same time each night, listening to soothing music. Routines increase the success of falling to sleep faster.   Use relaxation techniques. This can be using breathing and muscle tension release routines. It can also include visualizing peaceful scenes. You can also help control troubling or intruding thoughts by keeping your mind occupied with boring or repetitive thoughts like the old concept of counting sheep. You can make it more creative like imagining planting one beautiful flower after another in your backyard garden.   During your day, work to eliminate stress. When this is not possible use some of the previous suggestions to help reduce the anxiety that accompanies stressful situations.  MAKE SURE YOU:   Understand these instructions.   Will watch your condition.   Will get help right away if you are not doing well or get worse.  Document Released: 09/04/2000 Document Re-Released: 08/20/2008 [facility] Patient Information 2011 [place], [place].

## 2011-03-30 NOTE — Assessment & Plan Note (Signed)
She has changed her meds but still has good BP control

## 2011-03-30 NOTE — Assessment & Plan Note (Signed)
Continue tramadol and ibuprofen, she sees Dr. Ilean Skill tomorrow for an [facility]

## 2011-03-30 NOTE — Assessment & Plan Note (Signed)
Continue lunesta prn

## 2011-03-30 NOTE — Progress Notes (Signed)
Subjective:    Patient ID: Kim Rowland, female    DOB: January 02, 1946, 65 y.o.   MRN: [id number]  Hypertension This is a chronic problem. The current episode started more than 1 year ago. The problem has been gradually improving since onset. The problem is controlled. Pertinent negatives include no anxiety, blurred vision, chest pain, headaches, malaise/fatigue, neck pain, orthopnea, palpitations, peripheral edema, PND, shortness of breath or sweats. There are no associated agents to hypertension. Past treatments include calcium channel blockers and beta blockers. The current treatment provides moderate improvement. Compliance problems include medication cost, exercise and diet.  Hypertensive end-organ damage includes CVA.  Back Pain This is a chronic problem. The current episode started more than 1 year ago. The problem occurs intermittently. The problem is unchanged. The pain is present in the lumbar spine. The quality of the pain is described as stabbing and shooting. The pain radiates to the right knee. The pain is at a severity of 3/10. The pain is moderate. The pain is worse during the day. The symptoms are aggravated by bending. Stiffness is present all day. Associated symptoms include leg pain. Pertinent negatives include no abdominal pain, bladder incontinence, bowel incontinence, chest pain, dysuria, fever, headaches, numbness, paresis, paresthesias, pelvic pain, perianal numbness, tingling, weakness or weight loss. She has tried NSAIDs and analgesics for the symptoms. The treatment provided moderate relief.      Review of Systems  Constitutional: Negative for fever, chills, weight loss, malaise/fatigue, diaphoresis, activity change, appetite change, fatigue and unexpected weight change.  HENT: Negative for sore throat, facial swelling, trouble swallowing, neck pain, neck stiffness and voice change.   Eyes: Negative for blurred vision and photophobia.  Respiratory: Negative for apnea,  cough, choking, chest tightness, shortness of breath, wheezing and stridor.   Cardiovascular: Negative for chest pain, palpitations, orthopnea, leg swelling and PND.  Gastrointestinal: Negative for nausea, vomiting, abdominal pain, diarrhea, constipation, blood in stool, abdominal distention, anal bleeding, rectal pain and bowel incontinence.  Genitourinary: Negative for bladder incontinence, dysuria, urgency, frequency, hematuria, flank pain, decreased urine volume, enuresis, difficulty urinating, genital sores, menstrual problem, pelvic pain and dyspareunia.  Musculoskeletal: Positive for back pain. Negative for myalgias, joint swelling, arthralgias and gait problem.  Skin: Negative for color change, pallor, rash and wound.  Neurological: Negative for tingling, weakness, numbness, headaches and paresthesias.  Psychiatric/Behavioral: Positive for sleep disturbance (DFA). Negative for suicidal ideas, hallucinations, behavioral problems, confusion, dysphoric mood, decreased concentration and agitation. The patient is not nervous/anxious and is not hyperactive.        Objective:   Physical Exam  Vitals reviewed. Constitutional: She is oriented to person, place, and time. She appears well-developed and well-nourished. No distress.  HENT:  Head: Normocephalic and atraumatic.  Right Ear: External ear normal.  Left Ear: External ear normal.  Nose: Nose normal.  Mouth/Throat: Oropharynx is clear and moist. No oropharyngeal exudate.  Eyes: Conjunctivae and EOM are normal. Pupils are equal, round, and reactive to light. Right eye exhibits no discharge. Left eye exhibits no discharge. No scleral icterus.  Neck: Normal range of motion. Neck supple. No JVD present. No tracheal deviation present. No thyromegaly present.  Cardiovascular: Normal rate, regular rhythm, normal heart sounds and intact distal pulses.  Exam reveals no gallop and no friction rub.   No murmur heard. Pulmonary/Chest: Effort normal  and breath sounds normal. No stridor. No respiratory distress. She has no wheezes. She has no rales. She exhibits no tenderness.  Abdominal: Soft. Bowel sounds are normal. She exhibits  no distension and no mass. There is no tenderness. There is no rebound and no guarding.  Musculoskeletal: Normal range of motion. She exhibits no edema and no tenderness.  Lymphadenopathy:    She has no cervical adenopathy.  Neurological: She is alert and oriented to person, place, and time. She has normal reflexes. She displays normal reflexes. No cranial nerve deficit. She exhibits normal muscle tone.  Skin: Skin is warm and dry. No rash noted. She is not diaphoretic. No erythema. No pallor.  Psychiatric: She has a normal mood and affect. Her behavior is normal. Judgment and thought content normal.          Assessment & Plan:

## 2011-04-07 ENCOUNTER — Other Ambulatory Visit: Payer: Self-pay | Admitting: *Deleted

## 2011-04-07 ENCOUNTER — Telehealth: Payer: Self-pay

## 2011-04-07 MED ORDER — METOPROLOL SUCCINATE ER 50 MG PO TB24: 50.0000 mg | ORAL_TABLET | Freq: Every day | ORAL | Status: DC

## 2011-04-07 NOTE — Telephone Encounter (Signed)
refill 

## 2011-04-15 ENCOUNTER — Ambulatory Visit: Payer: Medicare HMO | Admitting: Internal Medicine

## 2011-05-04 ENCOUNTER — Ambulatory Visit (INDEPENDENT_AMBULATORY_CARE_PROVIDER_SITE_OTHER)
Admission: RE | Admit: 2011-05-04 | Discharge: 2011-05-04 | Disposition: A | Discharge status: Home / Self Care | Payer: Medicare HMO | Source: Ambulatory Visit | Attending: Internal Medicine | Admitting: Internal Medicine

## 2011-05-04 ENCOUNTER — Ambulatory Visit (INDEPENDENT_AMBULATORY_CARE_PROVIDER_SITE_OTHER): Payer: Medicare HMO | Admitting: Internal Medicine

## 2011-05-04 ENCOUNTER — Other Ambulatory Visit (INDEPENDENT_AMBULATORY_CARE_PROVIDER_SITE_OTHER): Payer: Medicare HMO

## 2011-05-04 ENCOUNTER — Encounter: Payer: Self-pay | Admitting: Internal Medicine

## 2011-05-04 VITALS — BP 130/68 | HR 65 | Temp 98.4°F | Resp 16

## 2011-05-04 DIAGNOSIS — M25569 Pain in unspecified knee: Secondary | ICD-10-CM

## 2011-05-04 DIAGNOSIS — E785 Hyperlipidemia, unspecified: Secondary | ICD-10-CM

## 2011-05-04 DIAGNOSIS — I10 Essential (primary) hypertension: Secondary | ICD-10-CM

## 2011-05-04 DIAGNOSIS — M129 Arthropathy, unspecified: Secondary | ICD-10-CM

## 2011-05-04 DIAGNOSIS — M25561 Pain in right knee: Secondary | ICD-10-CM

## 2011-05-04 DIAGNOSIS — M25562 Pain in left knee: Secondary | ICD-10-CM | POA: Insufficient documentation

## 2011-05-04 DIAGNOSIS — M1990 Unspecified osteoarthritis, unspecified site: Secondary | ICD-10-CM

## 2011-05-04 DIAGNOSIS — R7309 Other abnormal glucose: Secondary | ICD-10-CM

## 2011-05-04 LAB — COMPREHENSIVE METABOLIC PANEL
ALT: 18 U/L (ref 0–35)
AST: 16 U/L (ref 0–37)
Albumin: 4 g/dL (ref 3.5–5.2)
Alkaline Phosphatase: 70 U/L (ref 39–117)
Potassium: 3.5 mEq/L (ref 3.5–5.1)
Sodium: 139 mEq/L (ref 135–145)
Total Protein: 7.9 g/dL (ref 6.0–8.3)

## 2011-05-04 LAB — LIPID PANEL
Cholesterol: 176 mg/dL (ref 0–200)
HDL: 61.8 mg/dL (ref >39.00)
LDL Cholesterol: 94 mg/dL (ref 0–99)
VLDL: 20.2 mg/dL (ref 0.0–40.0)

## 2011-05-04 LAB — URIC ACID: Uric Acid, Serum: 4.5 mg/dL (ref 2.4–7.0)

## 2011-05-04 LAB — CBC WITH DIFFERENTIAL/PLATELET
Eosinophils Absolute: 0.1 10*3/uL (ref 0.0–0.7)
Lymphocytes Relative: 30.3 % (ref 12.0–46.0)
MCHC: 33.1 g/dL (ref 30.0–36.0)
MCV: 86.4 fl (ref 78.0–100.0)
Monocytes Absolute: 0.6 10*3/uL (ref 0.1–1.0)
Neutrophils Relative %: 59.8 % (ref 43.0–77.0)
Platelets: 370 10*3/uL (ref 150.0–400.0)
WBC: 6.7 10*3/uL (ref 4.5–10.5)

## 2011-05-04 MED ORDER — MELOXICAM 7.5 MG PO TABS: 7.5000 mg | ORAL_TABLET | Freq: Every day | ORAL | Status: AC

## 2011-05-04 MED ORDER — METHYLPREDNISOLONE ACETATE 80 MG/ML IJ SUSP
120.0000 mg | Freq: Once | INTRAMUSCULAR | Status: AC
  Administered 2011-05-04: 120 mg via INTRAMUSCULAR

## 2011-05-04 MED ORDER — KETOROLAC TROMETHAMINE 60 MG/2ML IM SOLN
60.0000 mg | Freq: Once | INTRAMUSCULAR | Status: AC
  Administered 2011-05-04: 60 mg via INTRAMUSCULAR

## 2011-05-04 NOTE — Progress Notes (Signed)
Subjective:    Patient ID: Kim Rowland, female    DOB: 1945-11-07, 65 y.o.   MRN: [id number]  Knee Pain  The incident occurred more than 1 week ago. [place]. The injury mechanism is unknown. The pain is present in the right knee and left knee. The quality of the pain is described as aching. The pain is at a severity of 5/10. The pain is mild. The pain has been constant since onset. Pertinent negatives include no inability to bear weight, loss of motion, loss of sensation, muscle weakness, numbness or tingling. She reports no foreign bodies present. The symptoms are aggravated by weight bearing and movement. Treatments tried: tramadol. The treatment provided no relief.  Hypertension This is a chronic problem. The current episode started more than 1 year ago. The problem has been gradually improving since onset. The problem is controlled. Pertinent negatives include no anxiety, blurred vision, chest pain, headaches, malaise/fatigue, neck pain, orthopnea, palpitations, peripheral edema, PND, shortness of breath or sweats. There are no associated agents to hypertension. Past treatments include calcium channel blockers and beta blockers. The current treatment provides significant improvement. There are no compliance problems.  There is no history of chronic renal disease.  Hyperlipidemia This is a chronic problem. The current episode started more than 1 year ago. The problem is controlled. Recent lipid tests were reviewed and are variable. She has no history of chronic renal disease, diabetes, hypothyroidism, liver disease, obesity or nephrotic syndrome. Factors aggravating her hyperlipidemia include no known factors. Pertinent negatives include no chest pain, myalgias or shortness of breath. Current antihyperlipidemic treatment includes statins. The current treatment provides moderate improvement of lipids. There are no compliance problems.       Review of Systems    Constitutional: Negative.  Negative for malaise/fatigue.  HENT: Negative.  Negative for neck pain.   Eyes: Negative.  Negative for blurred vision.  Respiratory: Negative.  Negative for shortness of breath.   Cardiovascular: Negative.  Negative for chest pain, palpitations, orthopnea and PND.  Gastrointestinal: Negative.   Genitourinary: Negative.   Musculoskeletal: Positive for arthralgias (both knees). Negative for myalgias, back pain, joint swelling and gait problem.  Skin: Negative.   Neurological: Negative for dizziness, tingling, tremors, seizures, syncope, facial asymmetry, speech difficulty, weakness, light-headedness, numbness and headaches.  Hematological: Negative for adenopathy. Does not bruise/bleed easily.  Psychiatric/Behavioral: Negative.        Objective:   Physical Exam  Vitals reviewed. Constitutional: She is oriented to person, place, and time. She appears well-developed and well-nourished. No distress.  HENT:  Head: Normocephalic and atraumatic.  Right Ear: External ear normal.  Left Ear: External ear normal.  Nose: Nose normal.  Mouth/Throat: Oropharynx is clear and moist. No oropharyngeal exudate.  Eyes: Conjunctivae and EOM are normal. Pupils are equal, round, and reactive to light. Right eye exhibits no discharge. Left eye exhibits no discharge. No scleral icterus.  Neck: Normal range of motion. Neck supple. No JVD present. No tracheal deviation present. No thyromegaly present.  Cardiovascular: Normal rate, regular rhythm, normal heart sounds and intact distal pulses.  Exam reveals no gallop and no friction rub.   No murmur heard. Pulmonary/Chest: Effort normal and breath sounds normal. No stridor. No respiratory distress. She has no wheezes. She has no rales. She exhibits no tenderness.  Abdominal: Soft. Bowel sounds are normal. She exhibits no distension and no mass. There is no tenderness. There is no rebound and no guarding.  Musculoskeletal:  Right  knee: Normal. She exhibits normal range of motion, no swelling, no effusion, no ecchymosis, no deformity, no laceration, no erythema, normal alignment, no LCL laxity, normal patellar mobility, normal meniscus and no MCL laxity. no tenderness found.       Left knee: Normal. She exhibits normal range of motion, no swelling, no effusion, no ecchymosis, no deformity, no laceration, no erythema, normal alignment, no LCL laxity, normal patellar mobility, no bony tenderness, normal meniscus and no MCL laxity. no tenderness found.  Lymphadenopathy:    She has no cervical adenopathy.  Neurological: She is alert and oriented to person, place, and time. She has normal reflexes. She displays normal reflexes. No cranial nerve deficit. She exhibits normal muscle tone. Coordination normal.  Skin: Skin is warm and dry. No rash noted. She is not diaphoretic. No erythema. No pallor.  Psychiatric: She has a normal mood and affect. Her behavior is normal. Judgment and thought content normal.      Lab Results  Component Value Date   WBC 6.3 02/18/2011   HGB 12.2 02/18/2011   HCT 35.9* 02/18/2011   PLT 376.0 02/18/2011   CHOL 172 09/08/2010   TRIG 192.0* 09/08/2010   HDL 49.70 09/08/2010   ALT 23 02/18/2011   AST 22 02/18/2011   NA 140 02/18/2011   K 3.5 02/18/2011   CL 104 02/18/2011   CREATININE 0.7 02/18/2011   BUN 9 02/18/2011   CO2 27 02/18/2011   TSH 2.39 02/18/2011   HGBA1C 5.7 02/18/2011      Assessment & Plan:

## 2011-05-04 NOTE — Assessment & Plan Note (Signed)
I will check plain films of both knees to look for effusions, DJD, spurs and will give depo-medrol and toradol in the office for pain

## 2011-05-04 NOTE — Assessment & Plan Note (Signed)
Check FLP, TSH, and CMP today

## 2011-05-04 NOTE — Patient Instructions (Signed)
Arthritis - Degenerative, Osteoarthritis You have osteoarthritis. This is the wear and tear arthritis that comes with aging. It is also called degenerative arthritis. This is common in people past middle age. It is caused by stress on the joints from living. The large weight bearing joints of the lower extremities are most often affected. The knees, hips, back, neck, and hands can become painful, swollen, and stiff. This is the most common type of arthritis. It comes on with age, carrying too much weight, and from injury. Treatment includes resting the sore joint until the pain and swelling improve. Crutches or a walker may be needed for severe flares. Only take over-the-counter or prescription medicines for pain, discomfort, or fever as directed by your caregiver. Local heat therapy may improve motion. Cortisone shots into the joint are sometimes used to reduce pain and swelling during flares. Osteoarthritis is usually not crippling and progresses slowly. There are things you can do to decrease pain:  Avoid high impact activities.   Exercise regularly.   Low impact exercises such as walking, biking and swimming help to keep the muscles strong and keep normal joint function.   Stretching helps to keep your range of motion.   Lose weight if you are overweight. This reduces joint stress.  In severe cases when you have pain at rest or increasing disability, joint surgery may be helpful. See your caregiver for follow-up treatment as recommended.  SEEK IMMEDIATE MEDICAL CARE IF:  You have severe joint pain.   Marked swelling and redness in your joint develops.   You develop a high fever.  Document Released: 09/07/2005 Document Re-Released: 01/07/2008 [employer] Patient Information 2011 [employer]. 

## 2011-05-04 NOTE — Assessment & Plan Note (Signed)
Will do labs today to look for inflammatory arthritides

## 2011-05-04 NOTE — Assessment & Plan Note (Signed)
Recheck LFTs today 

## 2011-05-04 NOTE — Assessment & Plan Note (Signed)
Her BP is well controlled 

## 2011-05-05 ENCOUNTER — Telehealth: Payer: Self-pay | Admitting: *Deleted

## 2011-05-05 ENCOUNTER — Encounter: Payer: Self-pay | Admitting: Internal Medicine

## 2011-05-05 DIAGNOSIS — M25561 Pain in right knee: Secondary | ICD-10-CM

## 2011-05-05 DIAGNOSIS — M1990 Unspecified osteoarthritis, unspecified site: Secondary | ICD-10-CM

## 2011-05-05 LAB — ANA: Anti Nuclear Antibody(ANA): NEGATIVE

## 2011-05-05 NOTE — Telephone Encounter (Signed)
Pt states that she was in for OV yesterday and she is still having severe pain and trouble walking. She states that the Meloxicam is not helping. She is asking MD's advisement on what she should do

## 2011-05-06 NOTE — Telephone Encounter (Signed)
Pt informed

## 2011-05-06 NOTE — Telephone Encounter (Signed)
PT

## 2011-05-12 ENCOUNTER — Telehealth: Payer: Self-pay | Admitting: *Deleted

## 2011-05-12 DIAGNOSIS — M545 Low back pain: Secondary | ICD-10-CM

## 2011-05-12 NOTE — Telephone Encounter (Signed)
Pharmacy called per pt's request. Pt is req 90 day supply to go to [facility] b/c this will be 0 out of pocket if patient uses mail order pharm. If ok, what qty is ok for 3 mth supply?

## 2011-05-13 ENCOUNTER — Telehealth: Payer: Self-pay | Admitting: *Deleted

## 2011-05-13 DIAGNOSIS — M1990 Unspecified osteoarthritis, unspecified site: Secondary | ICD-10-CM

## 2011-05-13 NOTE — Telephone Encounter (Signed)
3 month supply of what?

## 2011-05-13 NOTE — Telephone Encounter (Signed)
I will refer her to rheumatology

## 2011-05-13 NOTE — Telephone Encounter (Signed)
Patient requesting a call back regarding results letter.

## 2011-05-13 NOTE — Telephone Encounter (Signed)
Pt has a concern regarding results of Sed Rate levels and wants to know if this is contributing to her pain

## 2011-05-14 MED ORDER — TRAMADOL HCL 50 MG PO TABS: 50.0000 mg | ORAL_TABLET | Freq: Four times a day (QID) | ORAL | Status: DC

## 2011-05-14 NOTE — Telephone Encounter (Signed)
Sorry Tramadol.

## 2011-05-14 NOTE — Telephone Encounter (Signed)
done

## 2011-05-20 ENCOUNTER — Ambulatory Visit: Payer: Medicare HMO | Attending: Internal Medicine | Admitting: Physical Therapy

## 2011-05-20 DIAGNOSIS — R262 Difficulty in walking, not elsewhere classified: Secondary | ICD-10-CM | POA: Insufficient documentation

## 2011-05-20 DIAGNOSIS — IMO0001 Reserved for inherently not codable concepts without codable children: Secondary | ICD-10-CM | POA: Insufficient documentation

## 2011-05-20 DIAGNOSIS — M25569 Pain in unspecified knee: Secondary | ICD-10-CM | POA: Insufficient documentation

## 2011-05-27 ENCOUNTER — Ambulatory Visit: Payer: Medicare HMO | Attending: Internal Medicine | Admitting: Physical Therapy

## 2011-05-27 DIAGNOSIS — R262 Difficulty in walking, not elsewhere classified: Secondary | ICD-10-CM | POA: Insufficient documentation

## 2011-05-27 DIAGNOSIS — IMO0001 Reserved for inherently not codable concepts without codable children: Secondary | ICD-10-CM | POA: Insufficient documentation

## 2011-05-27 DIAGNOSIS — M25569 Pain in unspecified knee: Secondary | ICD-10-CM | POA: Insufficient documentation

## 2011-05-29 ENCOUNTER — Ambulatory Visit: Payer: Medicare HMO | Admitting: Internal Medicine

## 2011-06-01 ENCOUNTER — Ambulatory Visit: Payer: Medicare HMO | Admitting: Internal Medicine

## 2011-06-03 ENCOUNTER — Ambulatory Visit: Payer: Medicare HMO | Admitting: Physical Therapy

## 2011-06-05 ENCOUNTER — Encounter: Payer: Self-pay | Admitting: Internal Medicine

## 2011-06-05 ENCOUNTER — Ambulatory Visit (INDEPENDENT_AMBULATORY_CARE_PROVIDER_SITE_OTHER): Payer: Medicare HMO | Admitting: Internal Medicine

## 2011-06-05 DIAGNOSIS — G4700 Insomnia, unspecified: Secondary | ICD-10-CM

## 2011-06-05 DIAGNOSIS — I10 Essential (primary) hypertension: Secondary | ICD-10-CM

## 2011-06-05 MED ORDER — ESZOPICLONE 2 MG PO TABS: 2.0000 mg | ORAL_TABLET | Freq: Every evening | ORAL | Status: DC | PRN

## 2011-06-05 NOTE — Assessment & Plan Note (Signed)
Continue current meds 

## 2011-06-05 NOTE — Progress Notes (Signed)
Subjective:    Patient ID: Kim Rowland, female    DOB: Apr 29, 1946, 65 y.o.   MRN: [id number]  Hypertension This is a chronic problem. The current episode started more than 1 year ago. The problem has been gradually improving since onset. The problem is controlled. Pertinent negatives include no anxiety, blurred vision, chest pain, headaches, malaise/fatigue, neck pain, orthopnea, palpitations, peripheral edema, PND, shortness of breath or sweats. There are no associated agents to hypertension. Past treatments include beta blockers and calcium channel blockers. The current treatment provides significant improvement. There are no compliance problems.       Review of Systems  Constitutional: Negative.  Negative for malaise/fatigue.  HENT: Negative for facial swelling and neck pain.   Eyes: Negative.  Negative for blurred vision.  Respiratory: Negative for apnea, cough, choking, chest tightness, shortness of breath, wheezing and stridor.   Cardiovascular: Negative for chest pain, palpitations, orthopnea, leg swelling and PND.  Gastrointestinal: Negative for nausea, vomiting, abdominal pain, diarrhea, constipation, blood in stool, abdominal distention and anal bleeding.  Genitourinary: Negative for dysuria, urgency, frequency, hematuria, flank pain, decreased urine volume, enuresis, difficulty urinating and dyspareunia.  Musculoskeletal: Positive for arthralgias (both knees, better with PT after seeing a rheumatologist). Negative for myalgias, back pain, joint swelling and gait problem.  Skin: Negative for color change, pallor, rash and wound.  Neurological: Negative for dizziness, tremors, seizures, syncope, facial asymmetry, speech difficulty, weakness, light-headedness, numbness and headaches.  Hematological: Negative for adenopathy. Does not bruise/bleed easily.  Psychiatric/Behavioral: Positive for sleep disturbance (persistent, unchanged insomnia).       Objective:   Physical Exam    Vitals reviewed. Constitutional: She is oriented to person, place, and time. She appears well-developed and well-nourished. No distress.  HENT:  Mouth/Throat: Oropharynx is clear and moist. No oropharyngeal exudate.  Eyes: Conjunctivae are normal. Right eye exhibits no discharge. Left eye exhibits no discharge. No scleral icterus.  Neck: Normal range of motion. Neck supple. No JVD present. No tracheal deviation present. No thyromegaly present.  Cardiovascular: Normal rate, regular rhythm, normal heart sounds and intact distal pulses.  Exam reveals no gallop and no friction rub.   No murmur heard. Pulmonary/Chest: Effort normal and breath sounds normal. No stridor. No respiratory distress. She has no wheezes. She has no rales. She exhibits no tenderness.  Abdominal: Bowel sounds are normal. She exhibits no distension and no mass. There is no tenderness. There is no rebound and no guarding.  Musculoskeletal: Normal range of motion. She exhibits no edema and no tenderness.  Lymphadenopathy:    She has no cervical adenopathy.  Neurological: She is oriented to person, place, and time. She displays normal reflexes. She exhibits normal muscle tone.  Skin: Skin is warm and dry. No rash noted. She is not diaphoretic. No erythema. No pallor.  Psychiatric: She has a normal mood and affect. Her behavior is normal. Judgment and thought content normal.      Lab Results  Component Value Date   WBC 6.7 05/04/2011   HGB 13.2 05/04/2011   HCT 40.0 05/04/2011   PLT 370.0 05/04/2011   CHOL 176 05/04/2011   TRIG 101.0 05/04/2011   HDL 61.80 05/04/2011   ALT 18 05/04/2011   AST 16 05/04/2011   NA 139 05/04/2011   K 3.5 05/04/2011   CL 105 05/04/2011   CREATININE 0.8 05/04/2011   BUN 14 05/04/2011   CO2 26 05/04/2011   TSH 3.51 05/04/2011   HGBA1C 5.7 02/18/2011      Assessment &  Plan:

## 2011-06-05 NOTE — Patient Instructions (Signed)
Hypertension (High Blood Pressure) As your heart beats, it forces blood through your arteries. This force is your blood pressure. If the pressure is too high, it is called hypertension (HTN) or high blood pressure. HTN is dangerous because you may have it and not know it. High blood pressure may mean that your heart has to work harder to pump blood. Your arteries may be narrow or stiff. The extra work puts you at risk for heart disease, stroke, and other problems.  Blood pressure consists of two numbers, a higher number over a lower, 110/72, for example. It is stated as "110 over 72." The ideal is below 120 for the top number (systolic) and under 80 for the bottom (diastolic). Write down your blood pressure today. You should pay close attention to your blood pressure if you have certain conditions such as:  Heart failure.  Prior heart attack.   Diabetes   Chronic kidney disease.   Prior stroke.   Multiple risk factors for heart disease.   To see if you have HTN, your blood pressure should be measured while you are seated with your arm held at the level of the heart. It should be measured at least twice. A one-time elevated blood pressure reading (especially in the Emergency Department) does not mean that you need treatment. There may be conditions in which the blood pressure is different between your right and left arms. It is important to see your caregiver soon for a recheck. Most people have essential hypertension which means that there is not a specific cause. This type of high blood pressure may be lowered by changing lifestyle factors such as:  Stress.  Smoking.   Lack of exercise.   Excessive weight.  Drug/tobacco/alcohol use.   Eating less salt.   Most people do not have symptoms from high blood pressure until it has caused damage to the body. Effective treatment can often prevent, delay or reduce that damage. TREATMENT Treatment for high blood pressure, when a cause has been  identified, is directed at the cause. There are a large number of medications to treat HTN. These fall into several categories, and your caregiver will help you select the medicines that are best for you. Medications may have side effects. You should review side effects with your caregiver. If your blood pressure stays high after you have made lifestyle changes or started on medicines,   Your medication(s) may need to be changed.   Other problems may need to be addressed.   Be certain you understand your prescriptions, and know how and when to take your medicine.   Be sure to follow up with your caregiver within the time frame advised (usually within two weeks) to have your blood pressure rechecked and to review your medications.   If you are taking more than one medicine to lower your blood pressure, make sure you know how and at what times they should be taken. Taking two medicines at the same time can result in blood pressure that is too low.  SEEK IMMEDIATE MEDICAL CARE IF YOU DEVELOP:  A severe headache, blurred or changing vision, or confusion.   Unusual weakness or numbness, or a faint feeling.   Severe chest or abdominal pain, vomiting, or breathing problems.  MAKE SURE YOU:   Understand these instructions.   Will watch your condition.   Will get help right away if you are not doing well or get worse.  Document Released: 09/07/2005 Document Re-Released: 02/25/2010 [employer] Patient Information 2011 [employer].Insomnia Insomnia is frequent trouble falling and/or staying asleep. Insomnia can be a long term problem or a short term problem. Both are common. Insomnia can be a short term problem when the wakefulness is related to a certain stress or worry. Long term insomnia is often related to ongoing stress during waking hours and/or poor sleeping habits. Overtime, sleep deprivation itself can make the problem worse. Every little thing feels more severe because you are overtired  and your ability to cope is decreased. SYMPTOMS  Not feeling rested in the morning.   Anxiety and restlessness at bedtime.   Difficulty falling and staying asleep.  CAUSES  Stress, anxiety, and depression.   Poor sleeping habits.   Distractions such as TV in the bedroom.   Naps close to bedtime.   Engaging in emotionally charged conversations before bed.   Technical reading before sleep.   Alcohol and other sedatives. They may make the problem worse. They can hurt normal sleep patterns and normal dream activity.   Stimulants such as caffeine for several hours prior to bedtime.   Pain syndromes and shortness of breath can cause insomnia.   Exercise late at night.   Changing time zones may cause sleeping problems (jet lag).  It is sometimes helpful to have someone observe your sleeping patterns. They should look for periods of not breathing during the night (sleep apnea). They should also look to see how long those periods last. If you live alone or observers are uncertain, you can also be observed at a sleep clinic where your sleep patterns will be professionally monitored. Sleep apnea requires a checkup and treatment. Give your caregivers your medical history. Give your caregivers observations your family has made about your sleep.  TREATMENT  Your caregiver may prescribe treatment for an underlying medical disorders. Your caregiver can give advice or help if you are using alcohol or other drugs for self-medication. Treatment of underlying problems will usually eliminate insomnia problems.   Medications can be prescribed for short time use. They are generally not recommended for lengthy use.   Over-the-counter sleep medicines are not recommended for lengthy use. They can be habit forming.   You can promote easier sleeping by making lifestyle changes such as:   Using relaxation techniques that help with breathing and reduce muscle tension.   Exercising earlier in the day.     Changing your diet and the time of your last meal. No night time snacks.   Establish a regular time to go to bed.   Counseling can help with stressful problems and worry.   Soothing music and white noise may be helpful if there are background noises you cannot remove.   Stop tedious detailed work at least one hour before bedtime.  HOME CARE INSTRUCTIONS  Keep a diary. Inform your caregiver about your progress. This includes any medication side effects. See your caregiver regularly. Take note of:   Times when you are asleep.  Times when you are awake during the night.   The quality of your sleep.  How you feel the next day.   This information will help your caregiver care for you.  Get out of bed if you are still awake after 15 minutes. Read or do some quiet activity. Keep the lights down. Wait until you feel sleepy and go back to bed.   Keep regular sleeping and waking hours. Avoid naps.   Exercise regularly.   Avoid distractions at bedtime. Distractions include watching television or engaging in any intense  or detailed activity like attempting to balance the household checkbook.   Develop a bedtime ritual. Keep a familiar routine of bathing, brushing your teeth, climbing into bed at the same time each night, listening to soothing music. Routines increase the success of falling to sleep faster.   Use relaxation techniques. This can be using breathing and muscle tension release routines. It can also include visualizing peaceful scenes. You can also help control troubling or intruding thoughts by keeping your mind occupied with boring or repetitive thoughts like the old concept of counting sheep. You can make it more creative like imagining planting one beautiful flower after another in your backyard garden.   During your day, work to eliminate stress. When this is not possible use some of the previous suggestions to help reduce the anxiety that accompanies stressful situations.   MAKE SURE YOU:   Understand these instructions.   Will watch your condition.   Will get help right away if you are not doing well or get worse.  Document Released: 09/04/2000 Document Re-Released: 08/20/2008 [facility] Patient Information 2011 [place], [place].

## 2011-06-05 NOTE — Assessment & Plan Note (Signed)
Continue lunesta prn

## 2011-06-10 ENCOUNTER — Ambulatory Visit: Payer: Medicare HMO | Admitting: Physical Therapy

## 2011-06-17 ENCOUNTER — Ambulatory Visit: Payer: Medicare HMO | Admitting: Physical Therapy

## 2011-06-24 ENCOUNTER — Ambulatory Visit: Payer: Managed Care, Other (non HMO) | Attending: Internal Medicine | Admitting: Physical Therapy

## 2011-06-24 DIAGNOSIS — M25569 Pain in unspecified knee: Secondary | ICD-10-CM | POA: Insufficient documentation

## 2011-06-24 DIAGNOSIS — IMO0001 Reserved for inherently not codable concepts without codable children: Secondary | ICD-10-CM | POA: Insufficient documentation

## 2011-06-24 DIAGNOSIS — R262 Difficulty in walking, not elsewhere classified: Secondary | ICD-10-CM | POA: Insufficient documentation

## 2011-07-01 ENCOUNTER — Ambulatory Visit (INDEPENDENT_AMBULATORY_CARE_PROVIDER_SITE_OTHER): Payer: Medicare HMO | Admitting: Internal Medicine

## 2011-07-01 ENCOUNTER — Encounter: Payer: Self-pay | Admitting: Internal Medicine

## 2011-07-01 VITALS — BP 130/64 | HR 72 | Temp 98.3°F | Resp 16 | Wt 158.0 lb

## 2011-07-01 DIAGNOSIS — K219 Gastro-esophageal reflux disease without esophagitis: Secondary | ICD-10-CM

## 2011-07-01 DIAGNOSIS — I10 Essential (primary) hypertension: Secondary | ICD-10-CM

## 2011-07-01 MED ORDER — ESOMEPRAZOLE MAGNESIUM 40 MG PO CPDR: 40.0000 mg | DELAYED_RELEASE_CAPSULE | Freq: Every day | ORAL | Status: AC

## 2011-07-01 NOTE — Progress Notes (Signed)
  Subjective:    Patient ID: Kim Rowland, female    DOB: 11-26-1945, 65 y.o.   MRN: [id number]  Gastrophageal Reflux She complains of belching, heartburn and water brash. She reports no abdominal pain, no chest pain, no choking, no coughing, no dysphagia, no early satiety, no globus sensation, no hoarse voice, no nausea, no sore throat, no stridor, no tooth decay or no wheezing. This is a recurrent problem. The current episode started more than 1 year ago. The problem occurs occasionally. The problem has been gradually worsening. The heartburn duration is several minutes. The heartburn is located in the substernum. The heartburn is of mild intensity. The heartburn does not wake her from sleep. The heartburn does not limit her activity. The heartburn doesn't change with position. The symptoms are aggravated by certain foods. Pertinent negatives include no anemia, fatigue, melena, muscle weakness, orthopnea or weight loss. She has tried an antacid for the symptoms. The treatment provided moderate relief.      Review of Systems  Constitutional: Negative.  Negative for weight loss and fatigue.  HENT: Negative.  Negative for sore throat and hoarse voice.   Eyes: Negative.   Respiratory: Negative for apnea, cough, choking, chest tightness, shortness of breath, wheezing and stridor.   Cardiovascular: Negative for chest pain, palpitations and leg swelling.  Gastrointestinal: Positive for heartburn. Negative for dysphagia, nausea, abdominal pain and melena.  Genitourinary: Negative.   Musculoskeletal: Negative.  Negative for muscle weakness.  Skin: Negative.   Neurological: Negative.   Hematological: Negative.   Psychiatric/Behavioral: Negative.        Objective:   Physical Exam  Vitals reviewed. Constitutional: She is oriented to person, place, and time. She appears well-developed and well-nourished. No distress.  HENT:  Mouth/Throat: Oropharynx is clear and moist. No oropharyngeal exudate.   Eyes: Conjunctivae are normal. Right eye exhibits no discharge. Left eye exhibits no discharge. No scleral icterus.  Neck: Normal range of motion. Neck supple. No JVD present. No tracheal deviation present. No thyromegaly present.  Cardiovascular: Normal rate, regular rhythm, normal heart sounds and intact distal pulses.  Exam reveals no gallop and no friction rub.   No murmur heard. Pulmonary/Chest: Effort normal and breath sounds normal. No stridor. No respiratory distress. She has no wheezes. She has no rales. She exhibits no tenderness.  Abdominal: Soft. Bowel sounds are normal. She exhibits no distension and no mass. There is no tenderness. There is no rebound and no guarding.  Musculoskeletal: Normal range of motion. She exhibits no edema and no tenderness.  Lymphadenopathy:    She has no cervical adenopathy.  Neurological: She is oriented to person, place, and time.  Skin: Skin is warm and dry. No rash noted. She is not diaphoretic. No erythema. No pallor.  Psychiatric: She has a normal mood and affect. Her behavior is normal. Judgment and thought content normal.          Assessment & Plan:

## 2011-07-01 NOTE — Assessment & Plan Note (Signed)
Her BP is well controlled 

## 2011-07-01 NOTE — Patient Instructions (Signed)
Esophagitis (Heartburn) Esophagitis (heartburn) is a painful, burning sensation in the chest. It may feel worse in certain positions, such as lying down or bending over. It is caused by stomach acid backing up into the tube that carries food from the mouth down to the stomach (lower esophagus). TREATMENT There are a number of non-prescription medicines used to treat heartburn, including:  Antacids.   Acid reducers (also called H-2 blockers).   Proton-pump inhibitors.  HOME CARE INSTRUCTIONS  Raise the head of your bead by putting blocks under the legs.   Eat 2-3 hours before going to bed.   Stop smoking.   Try to reach and maintain a healthy weight.   Do not eat just a few very large meals. Instead, eat many smaller meals throughout the day.   Try to identify foods and beverages that make your symptoms worse, and avoid these.   Avoid tight clothing.   Do not exercise right after eating.  SEEK IMMEDIATE MEDICAL CARE IF YOU:  Have severe chest pain that goes down your arm, or into your jaw or neck.   Feel sweaty, dizzy, or lightheaded.   Are short of breath.   Throw up (vomit) blood.   Have difficulty or pain with swallowing.   Have bloody or black, tarry stools.   Have bouts of heartburn more than three times a week for more than two weeks.  Document Released: 10/15/2004 Document Re-Released: 12/02/2009 [employer] Patient Information 2011 [employer]. 

## 2011-07-01 NOTE — Assessment & Plan Note (Signed)
Restart nexium, she had a good response to it in the past

## 2011-07-02 ENCOUNTER — Telehealth: Payer: Self-pay | Admitting: *Deleted

## 2011-07-02 NOTE — Telephone Encounter (Signed)
Pt took one nexium yesterday, she c/o increase in epigastric discomfort, bloating and diarrhea. Pt took OTC liquid antacid w/some relief. Diarrhea has resolved with OTC medicine. Pt admitted to eating a greasy hamburger yesterday.   I advised patient to eat small frequent meals, keep bland diet and drink clear liquids - increase to normal diet slowly when she feels better. She will adjust her diet and call office w/any further problems.

## 2011-07-29 ENCOUNTER — Ambulatory Visit: Payer: Medicare HMO | Admitting: Internal Medicine

## 2011-08-02 HISTORY — PX: HEMI-MICRODISCECTOMY LUMBAR LAMINECTOMY LEVEL 4: SHX5849

## 2011-08-11 ENCOUNTER — Ambulatory Visit: Payer: Medicare HMO | Admitting: Internal Medicine

## 2011-09-08 ENCOUNTER — Other Ambulatory Visit: Payer: Self-pay | Admitting: Internal Medicine

## 2011-09-24 DIAGNOSIS — Z0289 Encounter for other administrative examinations: Secondary | ICD-10-CM

## 2011-10-06 ENCOUNTER — Other Ambulatory Visit: Payer: Self-pay | Admitting: Internal Medicine

## 2011-10-22 ENCOUNTER — Ambulatory Visit: Payer: Medicare HMO | Admitting: Internal Medicine

## 2011-11-25 ENCOUNTER — Ambulatory Visit: Payer: No Typology Code available for payment source | Admitting: Internal Medicine

## 2011-12-08 ENCOUNTER — Other Ambulatory Visit: Payer: Self-pay | Admitting: Neurological Surgery

## 2011-12-08 DIAGNOSIS — M47816 Spondylosis without myelopathy or radiculopathy, lumbar region: Secondary | ICD-10-CM

## 2011-12-10 ENCOUNTER — Other Ambulatory Visit (INDEPENDENT_AMBULATORY_CARE_PROVIDER_SITE_OTHER): Payer: BC Managed Care – PPO

## 2011-12-10 ENCOUNTER — Encounter: Payer: Self-pay | Admitting: Internal Medicine

## 2011-12-10 ENCOUNTER — Ambulatory Visit (INDEPENDENT_AMBULATORY_CARE_PROVIDER_SITE_OTHER): Payer: BC Managed Care – PPO | Admitting: Internal Medicine

## 2011-12-10 VITALS — BP 130/76 | HR 80 | Temp 97.8°F | Resp 20 | Wt 156.0 lb

## 2011-12-10 DIAGNOSIS — F329 Major depressive disorder, single episode, unspecified: Secondary | ICD-10-CM

## 2011-12-10 DIAGNOSIS — E785 Hyperlipidemia, unspecified: Secondary | ICD-10-CM

## 2011-12-10 DIAGNOSIS — R7309 Other abnormal glucose: Secondary | ICD-10-CM

## 2011-12-10 DIAGNOSIS — I10 Essential (primary) hypertension: Secondary | ICD-10-CM

## 2011-12-10 DIAGNOSIS — G4700 Insomnia, unspecified: Secondary | ICD-10-CM

## 2011-12-10 DIAGNOSIS — M1990 Unspecified osteoarthritis, unspecified site: Secondary | ICD-10-CM

## 2011-12-10 DIAGNOSIS — M129 Arthropathy, unspecified: Secondary | ICD-10-CM

## 2011-12-10 LAB — COMPREHENSIVE METABOLIC PANEL
BUN: 8 mg/dL (ref 6–23)
CO2: 28 mEq/L (ref 19–32)
Calcium: 9.5 mg/dL (ref 8.4–10.5)
Chloride: 107 mEq/L (ref 96–112)
Creatinine, Ser: 0.9 mg/dL (ref 0.4–1.2)
GFR: 79.21 mL/min (ref >60.00)
Glucose, Bld: 107 mg/dL — ABNORMAL HIGH (ref 70–99)

## 2011-12-10 LAB — CK: Total CK: 136 U/L (ref 7–177)

## 2011-12-10 LAB — CBC WITH DIFFERENTIAL/PLATELET
Basophils Absolute: 0 10*3/uL (ref 0.0–0.1)
Eosinophils Relative: 4.3 % (ref 0.0–5.0)
HCT: 38.6 % (ref 36.0–46.0)
Hemoglobin: 12.6 g/dL (ref 12.0–15.0)
Lymphocytes Relative: 41.9 % (ref 12.0–46.0)
Lymphs Abs: 2.1 10*3/uL (ref 0.7–4.0)
Monocytes Relative: 10.7 % (ref 3.0–12.0)
Platelets: 340 10*3/uL (ref 150.0–400.0)
RDW: 14.1 % (ref 11.5–14.6)
WBC: 4.9 10*3/uL (ref 4.5–10.5)

## 2011-12-10 LAB — TSH: TSH: 1.08 u[IU]/mL (ref 0.35–5.50)

## 2011-12-10 LAB — LIPID PANEL: Total CHOL/HDL Ratio: 3

## 2011-12-10 MED ORDER — DULOXETINE HCL 30 MG PO CPEP: 30.0000 mg | ORAL_CAPSULE | Freq: Every day | ORAL | Status: AC

## 2011-12-10 MED ORDER — ZOLPIDEM TARTRATE ER 12.5 MG PO TBCR: 12.5000 mg | EXTENDED_RELEASE_TABLET | Freq: Every evening | ORAL | Status: AC | PRN

## 2011-12-10 NOTE — Patient Instructions (Signed)
Hypercholesterolemia High Blood Cholesterol Cholesterol is a white, waxy, fat-like protein needed by your body in small amounts. The liver makes all the cholesterol you need. It is carried from the liver by the blood through the blood vessels. Deposits (plaque) may build up on blood vessel walls. This makes the arteries narrower and stiffer. Plaque increases the risk for heart attack and stroke. You cannot feel your cholesterol level even if it is very high. The only way to know is by a blood test to check your lipid (fats) levels. Once you know your cholesterol levels, you should keep a record of the test results. Work with your caregiver to to keep your levels in the desired range. WHAT THE RESULTS MEAN:  Total cholesterol is a rough measure of all the cholesterol in your blood.   LDL is the so-called bad cholesterol. This is the type that deposits cholesterol in the walls of the arteries. You want this level to be low.   HDL is the good cholesterol because it cleans the arteries and carries the LDL away. You want this level to be high.   Triglycerides are fat that the body can either burn for energy or store. High levels are closely linked to heart disease.  DESIRED LEVELS:  Total cholesterol below 200.   LDL below 100 for people at risk, below 70 for very high risk.   HDL above 50 is good, above 60 is best.   Triglycerides below 150.  HOW TO LOWER YOUR CHOLESTEROL:  Diet.   Choose fish or white meat chicken and [place], roasted or baked. Limit fatty cuts of red meat, fried foods, and processed meats, such as sausage and lunch meat.   Eat lots of fresh fruits and vegetables. Choose whole grains, beans, pasta, potatoes and cereals.   Use only small amounts of olive, corn or canola oils. Avoid butter, mayonnaise, shortening or palm kernel oils. Avoid foods with trans-fats.   Use skim/nonfat milk and low-fat/nonfat yogurt and cheeses. Avoid whole milk, cream, ice cream, egg yolks and  cheeses. Healthy desserts include angel food cake, gingersnaps, animal crackers, hard candy, popsicles, and low-fat/nonfat frozen yogurt. Avoid pastries, cakes, pies and cookies.   Exercise.   A regular program helps decrease LDL and raises HDL.   Helps with weight control.   Do things that increase your activity level like gardening, walking, or taking the stairs.   Medication.   May be prescribed by your caregiver to help lowering cholesterol and the risk for heart disease.   You may need medicine even if your levels are normal if you have several risk factors.  HOME CARE INSTRUCTIONS   Follow your diet and exercise programs as suggested by your caregiver.   Take medications as directed.   Have blood work done when your caregiver feels it is necessary.  MAKE SURE YOU:   Understand these instructions.   Will watch your condition.   Will get help right away if you are not doing well or get worse.  Document Released: 09/07/2005 Document Revised: 08/27/2011 Document Reviewed: 02/23/2007 [facility] Patient Information 2012 [place], [place].Depression, Adolescent and Adult Depression is a true and treatable medical condition. In general there are two kinds of depression:  Depression we all experience in some form. For example depression from the death of a loved one, financial distress or natural disasters will trigger or increase depression.   Clinical depression, on the other hand, appears without an apparent cause or reason. This depression is a disease. Depression  may be caused by chemical imbalance in the body and brain or may come as a response to a physical illness. Alcohol and other drugs can cause depression.  DIAGNOSIS  The diagnosis of depression is usually based upon symptoms and medical history. TREATMENT  Treatments for depression fall into three categories. These are:  Drug therapy. There are many medicines that treat depression. Responses may vary and sometimes trial  and error is necessary to determine the best medicines and dosage for a particular patient.   Psychotherapy, also called talking treatments, helps people resolve their problems by looking at them from a different point of view and by giving people insight into their own personal makeup. Traditional psychotherapy looks at a childhood source of a problem. Other psychotherapy will look at current conflicts and move toward solving those. If the cause of depression is drug use, counseling is available to help abstain. In time the depression will usually improve. If there were underlying causes for the chemical use, they can be addressed.   ECT (electroconvulsive therapy) or shock treatment is not as commonly used today. It is a very effective treatment for severe suicidal depression. During ECT electrical impulses are applied to the head. These impulses cause a generalized seizure. It can be effective but causes a loss of memory for recent events. Sometimes this loss of memory may include the last several months.  Treat all depression or suicide threats as serious. Obtain professional help. Do not wait to see if serious depression will get better over time without help. Seek help for yourself or those around you. In the [place] the number to the National Suicide Help Lines With 24 Hour Help Are: 1-800-SUICIDE 843-314-3499 Document Released: 09/04/2000 Document Revised: 08/27/2011 Document Reviewed: 04/25/2008 [facility] Patient Information 2012 [place], [place].

## 2011-12-10 NOTE — Assessment & Plan Note (Signed)
Her BP is well controlled today, I will check her labs to look for secondary causes and end organ damage

## 2011-12-10 NOTE — Assessment & Plan Note (Signed)
I will check her FLP CMP TSH today 

## 2011-12-10 NOTE — Assessment & Plan Note (Signed)
Continue current meds and also start cymbalta to help with the pain

## 2011-12-10 NOTE — Assessment & Plan Note (Signed)
She is not pleased with her response to [place] and asks to try Remus Loffler again, also will start cymbalta to treat the depression

## 2011-12-10 NOTE — Assessment & Plan Note (Signed)
Recheck her a1c today to see if she has developed DM II

## 2011-12-10 NOTE — Assessment & Plan Note (Signed)
-  Start cymbalta

## 2011-12-10 NOTE — Progress Notes (Signed)
Subjective:    Patient ID: Kim Rowland, female    DOB: 07/23/1946, 66 y.o.   MRN: [id number]  Hyperlipidemia This is a chronic problem. The current episode started more than 1 year ago. The problem is controlled. Recent lipid tests were reviewed and are variable. Kim Rowland has no history of chronic renal disease, diabetes, hypothyroidism, liver disease, obesity or nephrotic syndrome. Factors aggravating her hyperlipidemia include fatty foods. Pertinent negatives include no chest pain, focal sensory loss, focal weakness, leg pain, myalgias or shortness of breath. Current antihyperlipidemic treatment includes ezetimibe and statins. The current treatment provides moderate improvement of lipids. Compliance problems include adherence to exercise and adherence to diet.   Hypertension This is a chronic problem. The current episode started more than 1 year ago. The problem has been gradually improving since onset. The problem is controlled. Pertinent negatives include no anxiety, blurred vision, chest pain, headaches, malaise/fatigue, neck pain, orthopnea, palpitations, peripheral edema, PND, shortness of breath or sweats. There are no associated agents to hypertension. Past treatments include calcium channel blockers and beta blockers. The current treatment provides moderate improvement. Compliance problems include exercise and diet.  There is no history of chronic renal disease.      Review of Systems  Constitutional: Negative for fever, chills, malaise/fatigue, diaphoresis, activity change, appetite change, fatigue and unexpected weight change.  HENT: Negative.  Negative for neck pain.   Eyes: Negative.  Negative for blurred vision.  Respiratory: Negative for apnea, cough, choking, chest tightness, shortness of breath, wheezing and stridor.   Cardiovascular: Negative for chest pain, palpitations, orthopnea, leg swelling and PND.  Gastrointestinal: Negative for nausea, vomiting, abdominal pain, diarrhea,  constipation, blood in stool and anal bleeding.  Genitourinary: Negative.   Musculoskeletal: Positive for back pain (Kim Rowland saw Dr. August Saucer yesterday for DDD and "slipped disc") and arthralgias (knees and hips). Negative for myalgias, joint swelling and gait problem.  Skin: Negative for color change, pallor, rash and wound.  Neurological: Negative for dizziness, tremors, focal weakness, seizures, syncope, facial asymmetry, speech difficulty, weakness, light-headedness, numbness and headaches.  Hematological: Negative for adenopathy. Does not bruise/bleed easily.  Psychiatric/Behavioral: Positive for sleep disturbance, dysphoric mood (anhedonia, sadness, rimunations, crying spells) and decreased concentration. Negative for suicidal ideas, hallucinations, behavioral problems, confusion, self-injury and agitation. The patient is not nervous/anxious and is not hyperactive.        Objective:   Physical Exam  Vitals reviewed. Constitutional: Kim Rowland is oriented to person, place, and time. Kim Rowland appears well-developed and well-nourished. No distress.  HENT:  Head: Normocephalic and atraumatic.  Mouth/Throat: Oropharynx is clear and moist. No oropharyngeal exudate.  Eyes: Conjunctivae are normal. Right eye exhibits no discharge. Left eye exhibits no discharge. No scleral icterus.  Neck: Normal range of motion. Neck supple. No JVD present. No tracheal deviation present. No thyromegaly present.  Cardiovascular: Normal rate, regular rhythm, normal heart sounds and intact distal pulses.  Exam reveals no gallop and no friction rub.   No murmur heard. Pulmonary/Chest: Effort normal and breath sounds normal. No stridor. No respiratory distress. Kim Rowland has no wheezes. Kim Rowland has no rales. Kim Rowland exhibits no tenderness.  Abdominal: Soft. Bowel sounds are normal. Kim Rowland exhibits no distension and no mass. There is no tenderness. There is no rebound and no guarding.  Musculoskeletal: Normal range of motion. Kim Rowland exhibits no edema and no  tenderness.  Lymphadenopathy:    Kim Rowland has no cervical adenopathy.  Neurological: Kim Rowland is oriented to person, place, and time.  Skin: Skin is warm and dry. No rash  noted. Kim Rowland is not diaphoretic. No erythema. No pallor.  Psychiatric: Her speech is normal and behavior is normal. Judgment and thought content normal. Her mood appears not anxious. Her affect is not angry, not blunt, not labile and not inappropriate. Kim Rowland is not agitated, not aggressive, is not hyperactive, not slowed, not withdrawn, not actively hallucinating and not combative. Thought content is not paranoid and not delusional. Cognition and memory are normal. Cognition and memory are not impaired. Kim Rowland does not express impulsivity or inappropriate judgment. Kim Rowland exhibits a depressed mood (tearful). Kim Rowland expresses no homicidal and no suicidal ideation. Kim Rowland expresses no suicidal plans and no homicidal plans. Kim Rowland exhibits normal recent memory and normal remote memory. Kim Rowland is attentive.      Lab Results  Component Value Date   WBC 6.7 05/04/2011   HGB 13.2 05/04/2011   HCT 40.0 05/04/2011   PLT 370.0 05/04/2011   GLUCOSE 91 05/04/2011   CHOL 176 05/04/2011   TRIG 101.0 05/04/2011   HDL 61.80 05/04/2011   LDLCALC 94 05/04/2011   ALT 18 05/04/2011   AST 16 05/04/2011   NA 139 05/04/2011   K 3.5 05/04/2011   CL 105 05/04/2011   CREATININE 0.8 05/04/2011   BUN 14 05/04/2011   CO2 26 05/04/2011   TSH 3.51 05/04/2011   HGBA1C 5.7 02/18/2011      Assessment & Plan:

## 2011-12-23 ENCOUNTER — Ambulatory Visit (INDEPENDENT_AMBULATORY_CARE_PROVIDER_SITE_OTHER): Payer: BC Managed Care – PPO | Admitting: Internal Medicine

## 2011-12-23 ENCOUNTER — Encounter: Payer: Self-pay | Admitting: Internal Medicine

## 2011-12-23 VITALS — BP 138/82 | HR 76 | Temp 97.8°F | Resp 16 | Wt 153.5 lb

## 2011-12-23 DIAGNOSIS — F329 Major depressive disorder, single episode, unspecified: Secondary | ICD-10-CM

## 2011-12-23 DIAGNOSIS — M545 Low back pain: Secondary | ICD-10-CM

## 2011-12-23 DIAGNOSIS — I10 Essential (primary) hypertension: Secondary | ICD-10-CM

## 2011-12-23 DIAGNOSIS — R7309 Other abnormal glucose: Secondary | ICD-10-CM

## 2011-12-23 MED ORDER — OXYCODONE HCL 20 MG PO TB12: 20.0000 mg | ORAL_TABLET | Freq: Two times a day (BID) | ORAL | Status: AC

## 2011-12-23 NOTE — Patient Instructions (Signed)
Back Pain, Adult Low back pain is very common. About 1 in 5 people have back pain.The cause of low back pain is rarely dangerous. The pain often gets better over time.About half of people with a sudden onset of back pain feel better in just 2 weeks. About 8 in 10 people feel better by 6 weeks.  CAUSES Some common causes of back pain include:  Strain of the muscles or ligaments supporting the spine.   Wear and tear (degeneration) of the spinal discs.   Arthritis.   Direct injury to the back.  DIAGNOSIS Most of the time, the direct cause of low back pain is not known.However, back pain can be treated effectively even when the exact cause of the pain is unknown.Answering your caregiver's questions about your overall health and symptoms is one of the most accurate ways to make sure the cause of your pain is not dangerous. If your caregiver needs more information, he or she may order lab work or imaging tests (X-rays or MRIs).However, even if imaging tests show changes in your back, this usually does not require surgery. HOME CARE INSTRUCTIONS For many people, back pain returns.Since low back pain is rarely dangerous, it is often a condition that people can learn to manageon their own.   Remain active. It is stressful on the back to sit or stand in one place. Do not sit, drive, or stand in one place for more than 30 minutes at a time. Take short walks on level surfaces as soon as pain allows.Try to increase the length of time you walk each day.   Do not stay in bed.Resting more than 1 or 2 days can delay your recovery.   Do not avoid exercise or work.Your body is made to move.It is not dangerous to be active, even though your back may hurt.Your back will likely heal faster if you return to being active before your pain is gone.   Pay attention to your body when you bend and lift. Many people have less discomfortwhen lifting if they bend their knees, keep the load close to their  bodies,and avoid twisting. Often, the most comfortable positions are those that put less stress on your recovering back.   Find a comfortable position to sleep. Use a firm mattress and lie on your side with your knees slightly bent. If you lie on your back, put a pillow under your knees.   Only take over-the-counter or prescription medicines as directed by your caregiver. Over-the-counter medicines to reduce pain and inflammation are often the most helpful.Your caregiver may prescribe muscle relaxant drugs.These medicines help dull your pain so you can more quickly return to your normal activities and healthy exercise.   Put ice on the injured area.   Put ice in a plastic bag.   Place a towel between your skin and the bag.   Leave the ice on for 15 to 20 minutes, 3 to 4 times a day for the first 2 to 3 days. After that, ice and heat may be alternated to reduce pain and spasms.   Ask your caregiver about trying back exercises and gentle massage. This may be of some benefit.   Avoid feeling anxious or stressed.Stress increases muscle tension and can worsen back pain.It is important to recognize when you are anxious or stressed and learn ways to manage it.Exercise is a great option.  SEEK MEDICAL CARE IF:  You have pain that is not relieved with rest or medicine.   You have   pain that does not improve in 1 week.   You have new symptoms.   You are generally not feeling well.  SEEK IMMEDIATE MEDICAL CARE IF:   You have pain that radiates from your back into your legs.   You develop new bowel or bladder control problems.   You have unusual weakness or numbness in your arms or legs.   You develop nausea or vomiting.   You develop abdominal pain.   You feel faint.  Document Released: 09/07/2005 Document Revised: 08/27/2011 Document Reviewed: 01/26/2011 [employer] Patient Information 2012 [employer]. 

## 2011-12-23 NOTE — Assessment & Plan Note (Signed)
Her BP is well controlled 

## 2011-12-23 NOTE — Progress Notes (Signed)
Subjective:    Patient ID: Kim Rowland, female    DOB: [birth year], 66 y.o.   MRN: [id number]  Back Pain This is a recurrent problem. The current episode started more than 1 year ago. The problem occurs constantly. The problem has been gradually worsening since onset. The pain is present in the lumbar spine. The quality of the pain is described as aching. The pain radiates to the right thigh. The pain is at a severity of 6/10. The pain is moderate. The pain is worse during the day. The symptoms are aggravated by bending and standing. Stiffness is present all day. Associated symptoms include leg pain (right leg). Pertinent negatives include no abdominal pain, bladder incontinence, bowel incontinence, chest pain, dysuria, fever, headaches, numbness, paresis, paresthesias, pelvic pain, perianal numbness, tingling, weakness or weight loss. She has tried NSAIDs, muscle relaxant and analgesics (she had an ESI last week) for the symptoms. The treatment provided mild relief.      Review of Systems  Constitutional: Negative for fever, chills, weight loss, diaphoresis, activity change, appetite change, fatigue and unexpected weight change.  HENT: Negative.   Eyes: Negative.   Respiratory: Negative for cough, chest tightness, shortness of breath, wheezing and stridor.   Cardiovascular: Negative for chest pain, palpitations and leg swelling.  Gastrointestinal: Negative for nausea, vomiting, abdominal pain, diarrhea, constipation, blood in stool, abdominal distention, anal bleeding and bowel incontinence.  Genitourinary: Negative for bladder incontinence, dysuria, urgency, frequency, hematuria, flank pain, decreased urine volume, enuresis, difficulty urinating, pelvic pain and dyspareunia.  Musculoskeletal: Positive for back pain. Negative for myalgias, joint swelling, arthralgias and gait problem.  Skin: Negative for color change, pallor, rash and wound.  Neurological: Negative for dizziness, tingling,  tremors, seizures, syncope, facial asymmetry, speech difficulty, weakness, light-headedness, numbness, headaches and paresthesias.  Hematological: Negative for adenopathy. Does not bruise/bleed easily.  Psychiatric/Behavioral: Positive for dysphoric mood. Negative for suicidal ideas, hallucinations, behavioral problems, confusion, sleep disturbance, self-injury, decreased concentration and agitation. The patient is not nervous/anxious and is not hyperactive.        Objective:   Physical Exam  Vitals reviewed. Constitutional: She appears well-developed and well-nourished. No distress.  HENT:  Head: Normocephalic and atraumatic.  Mouth/Throat: Oropharynx is clear and moist. No oropharyngeal exudate.  Eyes: Conjunctivae are normal. Right eye exhibits no discharge. Left eye exhibits no discharge. No scleral icterus.  Neck: Normal range of motion. Neck supple. No JVD present. No tracheal deviation present. No thyromegaly present.  Cardiovascular: Normal rate, normal heart sounds and intact distal pulses.  Exam reveals no gallop and no friction rub.   No murmur heard. Pulmonary/Chest: Effort normal and breath sounds normal. No stridor. No respiratory distress. She has no wheezes. She has no rales. She exhibits no tenderness.  Abdominal: Soft. Bowel sounds are normal. She exhibits no distension. There is no tenderness. There is no rebound and no guarding.  Musculoskeletal: Normal range of motion. She exhibits no edema and no tenderness.  Lymphadenopathy:    She has no cervical adenopathy.  Neurological: She is alert. She has normal strength. She displays no atrophy, no tremor and normal reflexes. No cranial nerve deficit or sensory deficit. She exhibits normal muscle tone. She displays a negative Romberg sign. She displays no seizure activity. Coordination and gait normal. She displays no Babinski's sign on the right side. She displays Babinski's sign on the left side.  Reflex Scores:      Tricep  reflexes are 1+ on the right side and 1+ on the left side.  Bicep reflexes are 1+ on the right side and 1+ on the left side.      Brachioradialis reflexes are 1+ on the right side and 1+ on the left side.      Patellar reflexes are 1+ on the right side and 1+ on the left side.      Achilles reflexes are 1+ on the right side and 1+ on the left side.      + SLR in RLE, - SLR in LLE  Skin: Skin is warm and dry. No rash noted. She is not diaphoretic. No erythema. No pallor.  Psychiatric: She has a normal mood and affect. Her behavior is normal. Judgment and thought content normal.      Lab Results  Component Value Date   WBC 4.9 12/10/2011   HGB 12.6 12/10/2011   HCT 38.6 12/10/2011   PLT 340.0 12/10/2011   GLUCOSE 107* 12/10/2011   CHOL 134 12/10/2011   TRIG 64.0 12/10/2011   HDL 53.30 12/10/2011   LDLCALC 68 12/10/2011   ALT 21 12/10/2011   AST 23 12/10/2011   NA 141 12/10/2011   K 3.6 12/10/2011   CL 107 12/10/2011   CREATININE 0.9 12/10/2011   BUN 8 12/10/2011   CO2 28 12/10/2011   TSH 1.08 12/10/2011   HGBA1C 5.7 12/10/2011      Assessment & Plan:

## 2011-12-23 NOTE — Assessment & Plan Note (Signed)
She has pre-diabetes 

## 2011-12-23 NOTE — Assessment & Plan Note (Signed)
I have changed the oxycodone-ir to oxycontin for better pain control, she will continue the other meds for pain and will continue to f/up with the other 3 doctors that are seeing her for LBP

## 2011-12-23 NOTE — Assessment & Plan Note (Signed)
She is improving on cymbalta

## 2012-01-07 ENCOUNTER — Ambulatory Visit
Admission: RE | Admit: 2012-01-07 | Discharge: 2012-01-07 | Disposition: A | Discharge status: Home / Self Care | Payer: BC Managed Care – PPO | Source: Ambulatory Visit | Attending: Neurological Surgery | Admitting: Neurological Surgery

## 2012-01-07 VITALS — BP 138/63 | HR 69

## 2012-01-07 DIAGNOSIS — M47816 Spondylosis without myelopathy or radiculopathy, lumbar region: Secondary | ICD-10-CM

## 2012-01-07 MED ORDER — IOHEXOL 180 MG/ML  SOLN
1.0000 mL | Freq: Once | INTRAMUSCULAR | Status: AC | PRN
  Administered 2012-01-07: 1 mL via EPIDURAL

## 2012-01-07 MED ORDER — METHYLPREDNISOLONE ACETATE 40 MG/ML INJ SUSP (RADIOLOG
120.0000 mg | Freq: Once | INTRAMUSCULAR | Status: AC
  Administered 2012-01-07: 120 mg via EPIDURAL

## 2012-01-07 NOTE — Progress Notes (Signed)
Patient states she took Benadryl 50mg  PO before today's appointment.  jkl

## 2012-03-02 ENCOUNTER — Ambulatory Visit: Payer: BC Managed Care – PPO | Admitting: Internal Medicine

## 2012-03-29 ENCOUNTER — Ambulatory Visit: Payer: BC Managed Care – PPO | Admitting: Internal Medicine

## 2012-04-07 ENCOUNTER — Encounter: Payer: Self-pay | Admitting: Internal Medicine

## 2012-04-07 ENCOUNTER — Ambulatory Visit (INDEPENDENT_AMBULATORY_CARE_PROVIDER_SITE_OTHER): Payer: BC Managed Care – PPO | Admitting: Internal Medicine

## 2012-04-07 VITALS — BP 146/78 | HR 78 | Temp 98.3°F | Resp 16 | Wt 144.0 lb

## 2012-04-07 DIAGNOSIS — G4700 Insomnia, unspecified: Secondary | ICD-10-CM

## 2012-04-07 DIAGNOSIS — I10 Essential (primary) hypertension: Secondary | ICD-10-CM

## 2012-04-07 DIAGNOSIS — M545 Low back pain: Secondary | ICD-10-CM

## 2012-04-07 MED ORDER — EPINEPHRINE 0.3 MG/0.3ML IJ DEVI: 0.3000 mg | Freq: Once | INTRAMUSCULAR | Status: AC

## 2012-04-07 MED ORDER — HYDROCODONE-ACETAMINOPHEN 5-500 MG PO TABS: 2.0000 | ORAL_TABLET | Freq: Four times a day (QID) | ORAL | Status: AC | PRN

## 2012-04-07 MED ORDER — NEBIVOLOL HCL 10 MG PO TABS: 10.0000 mg | ORAL_TABLET | Freq: Every day | ORAL | Status: DC

## 2012-04-07 MED ORDER — ESZOPICLONE 3 MG PO TABS: 3.0000 mg | ORAL_TABLET | Freq: Every day | ORAL | Status: DC

## 2012-04-07 NOTE — Assessment & Plan Note (Addendum)
Her BP is not well controlled and she thinks amlodipine affects her urine flow so I have changed her to bystolic for BP control, she will stop taking metoprolol

## 2012-04-07 NOTE — Assessment & Plan Note (Signed)
She will continue Lunesta as needed

## 2012-04-07 NOTE — Patient Instructions (Signed)
Back Pain, Adult Low back pain is very common. About 1 in 5 people have back pain.The cause of low back pain is rarely dangerous. The pain often gets better over time.About half of people with a sudden onset of back pain feel better in just 2 weeks. About 8 in 10 people feel better by 6 weeks.  CAUSES Some common causes of back pain include:  Strain of the muscles or ligaments supporting the spine.   Wear and tear (degeneration) of the spinal discs.   Arthritis.   Direct injury to the back.  DIAGNOSIS Most of the time, the direct cause of low back pain is not known.However, back pain can be treated effectively even when the exact cause of the pain is unknown.Answering your caregiver's questions about your overall health and symptoms is one of the most accurate ways to make sure the cause of your pain is not dangerous. If your caregiver needs more information, he or she may order lab work or imaging tests (X-rays or MRIs).However, even if imaging tests show changes in your back, this usually does not require surgery. HOME CARE INSTRUCTIONS For many people, back pain returns.Since low back pain is rarely dangerous, it is often a condition that people can learn to manageon their own.   Remain active. It is stressful on the back to sit or stand in one place. Do not sit, drive, or stand in one place for more than 30 minutes at a time. Take short walks on level surfaces as soon as pain allows.Try to increase the length of time you walk each day.   Do not stay in bed.Resting more than 1 or 2 days can delay your recovery.   Do not avoid exercise or work.Your body is made to move.It is not dangerous to be active, even though your back may hurt.Your back will likely heal faster if you return to being active before your pain is gone.   Pay attention to your body when you bend and lift. Many people have less discomfortwhen lifting if they bend their knees, keep the load close to their  bodies,and avoid twisting. Often, the most comfortable positions are those that put less stress on your recovering back.   Find a comfortable position to sleep. Use a firm mattress and lie on your side with your knees slightly bent. If you lie on your back, put a pillow under your knees.   Only take over-the-counter or prescription medicines as directed by your caregiver. Over-the-counter medicines to reduce pain and inflammation are often the most helpful.Your caregiver may prescribe muscle relaxant drugs.These medicines help dull your pain so you can more quickly return to your normal activities and healthy exercise.   Put ice on the injured area.   Put ice in a plastic bag.   Place a towel between your skin and the bag.   Leave the ice on for 15 to 20 minutes, 3 to 4 times a day for the first 2 to 3 days. After that, ice and heat may be alternated to reduce pain and spasms.   Ask your caregiver about trying back exercises and gentle massage. This may be of some benefit.   Avoid feeling anxious or stressed.Stress increases muscle tension and can worsen back pain.It is important to recognize when you are anxious or stressed and learn ways to manage it.Exercise is a great option.  SEEK MEDICAL CARE IF:  You have pain that is not relieved with rest or medicine.   You have   pain that does not improve in 1 week.   You have new symptoms.   You are generally not feeling well.  SEEK IMMEDIATE MEDICAL CARE IF:   You have pain that radiates from your back into your legs.   You develop new bowel or bladder control problems.   You have unusual weakness or numbness in your arms or legs.   You develop nausea or vomiting.   You develop abdominal pain.   You feel faint.  Document Released: 09/07/2005 Document Revised: 08/27/2011 Document Reviewed: 01/26/2011 Exit[employer] Patient Information 2012 [employer].Hypertension As your heart beats, it forces blood through your arteries. This  force is your blood pressure. If the pressure is too high, it is called hypertension (HTN) or high blood pressure. HTN is dangerous because you may have it and not know it. High blood pressure may mean that your heart has to work harder to pump blood. Your arteries may be narrow or stiff. The extra work puts you at risk for heart disease, stroke, and other problems.  Blood pressure consists of two numbers, a higher number over a lower, 110/72, for example. It is stated as "110 over 72." The ideal is below 120 for the top number (systolic) and under 80 for the bottom (diastolic). Write down your blood pressure today. You should pay close attention to your blood pressure if you have certain conditions such as:  Heart failure.   Prior heart attack.   Diabetes   Chronic kidney disease.   Prior stroke.   Multiple risk factors for heart disease.  To see if you have HTN, your blood pressure should be measured while you are seated with your arm held at the level of the heart. It should be measured at least twice. A one-time elevated blood pressure reading (especially in the Emergency Department) does not mean that you need treatment. There may be conditions in which the blood pressure is different between your right and left arms. It is important to see your caregiver soon for a recheck. Most people have essential hypertension which means that there is not a specific cause. This type of high blood pressure may be lowered by changing lifestyle factors such as:  Stress.   Smoking.   Lack of exercise.   Excessive weight.   Drug/tobacco/alcohol use.   Eating less salt.  Most people do not have symptoms from high blood pressure until it has caused damage to the body. Effective treatment can often prevent, delay or reduce that damage. TREATMENT  When a cause has been identified, treatment for high blood pressure is directed at the cause. There are a large number of medications to treat HTN. These  fall into several categories, and your caregiver will help you select the medicines that are best for you. Medications may have side effects. You should review side effects with your caregiver. If your blood pressure stays high after you have made lifestyle changes or started on medicines,   Your medication(s) may need to be changed.   Other problems may need to be addressed.   Be certain you understand your prescriptions, and know how and when to take your medicine.   Be sure to follow up with your caregiver within the time frame advised (usually within two weeks) to have your blood pressure rechecked and to review your medications.   If you are taking more than one medicine to lower your blood pressure, make sure you know how and at what times they should be taken. Taking   two medicines at the same time can result in blood pressure that is too low.  SEEK IMMEDIATE MEDICAL CARE IF:  You develop a severe headache, blurred or changing vision, or confusion.   You have unusual weakness or numbness, or a faint feeling.   You have severe chest or abdominal pain, vomiting, or breathing problems.  MAKE SURE YOU:   Understand these instructions.   Will watch your condition.   Will get help right away if you are not doing well or get worse.  Document Released: 09/07/2005 Document Revised: 08/27/2011 Document Reviewed: 04/27/2008 ExitCare Patient Information 2012 [employer]. 

## 2012-04-07 NOTE — Progress Notes (Signed)
Subjective:    Patient ID: Kim Rowland, female    DOB: [birth year], 66 y.o.   MRN: [id number]  Back Pain This is a chronic problem. The current episode started more than 1 year ago. The problem occurs constantly. The problem has been gradually worsening since onset. The pain is present in the lumbar spine. The quality of the pain is described as shooting and stabbing. The pain radiates to the right foot. The pain is at a severity of 7/10. The pain is moderate. The pain is worse during the day. The symptoms are aggravated by standing and bending. Associated symptoms include leg pain (right side), numbness (in her right leg), tingling (right leg) and weakness (right leg). Pertinent negatives include no abdominal pain, bladder incontinence, bowel incontinence, chest pain, dysuria, fever, headaches, paresis, paresthesias, pelvic pain, perianal numbness or weight loss. She has tried NSAIDs and muscle relaxant (tramadol, hydrocodone (friend's supply), oxycodone) for the symptoms. The treatment provided mild relief.  Hypertension This is a chronic problem. The current episode started more than 1 year ago. The problem is unchanged. The problem is uncontrolled. Associated symptoms include anxiety. Pertinent negatives include no blurred vision, chest pain, headaches, malaise/fatigue, neck pain, orthopnea, palpitations, peripheral edema, PND, shortness of breath or sweats. Agents associated with hypertension include NSAIDs. Past treatments include calcium channel blockers. The current treatment provides mild improvement. Compliance problems include exercise and diet.       Review of Systems  Constitutional: Negative for fever, chills, weight loss, malaise/fatigue, diaphoresis, activity change, appetite change, fatigue and unexpected weight change.  HENT: Negative.  Negative for neck pain.   Eyes: Negative.  Negative for blurred vision.  Respiratory: Negative for cough, chest tightness, shortness of breath,  wheezing and stridor.   Cardiovascular: Negative for chest pain, palpitations, orthopnea, leg swelling and PND.  Gastrointestinal: Negative for nausea, vomiting, abdominal pain, diarrhea, constipation, anal bleeding and bowel incontinence.  Genitourinary: Negative.  Negative for bladder incontinence, dysuria and pelvic pain.  Musculoskeletal: Positive for back pain. Negative for myalgias, joint swelling, arthralgias and gait problem.  Skin: Negative for color change, pallor, rash and wound.  Neurological: Positive for tingling (right leg), weakness (right leg) and numbness (in her right leg). Negative for dizziness, tremors, seizures, syncope, facial asymmetry, speech difficulty, headaches and paresthesias.  Hematological: Negative for adenopathy. Does not bruise/bleed easily.  Psychiatric/Behavioral: Positive for disturbed wake/sleep cycle (DFA, lunesta works well). Negative for suicidal ideas, hallucinations, behavioral problems, self-injury, dysphoric mood, decreased concentration and agitation. The patient is nervous/anxious. The patient is not hyperactive.        Objective:   Physical Exam  Vitals reviewed. Constitutional: She appears well-developed and well-nourished. No distress.  HENT:  Head: Normocephalic and atraumatic.  Mouth/Throat: Oropharynx is clear and moist. No oropharyngeal exudate.  Eyes: Conjunctivae are normal. Right eye exhibits no discharge. Left eye exhibits no discharge. No scleral icterus.  Neck: Normal range of motion. Neck supple. No JVD present. No tracheal deviation present. No thyromegaly present.  Cardiovascular: Normal rate, regular rhythm, normal heart sounds and intact distal pulses.  Exam reveals no gallop and no friction rub.   No murmur heard. Pulmonary/Chest: Effort normal and breath sounds normal. No stridor. No respiratory distress. She has no wheezes. She has no rales. She exhibits no tenderness.  Abdominal: Soft. Bowel sounds are normal. She exhibits  no distension and no mass. There is no tenderness. There is no rebound and no guarding.  Musculoskeletal: Normal range of motion. She exhibits no edema and no  tenderness.       Lumbar back: Normal. She exhibits normal range of motion, no tenderness, no bony tenderness, no swelling, no edema, no deformity, no laceration, no pain, no spasm and normal pulse.  Lymphadenopathy:    She has no cervical adenopathy.  Neurological: She is alert. She displays abnormal reflex. She displays no atrophy and no tremor. No cranial nerve deficit or sensory deficit. She exhibits abnormal muscle tone (mild weakness in her RLE). She displays a negative Romberg sign. She displays no seizure activity. Coordination and gait normal. She displays no Babinski's sign on the right side. She displays no Babinski's sign on the left side.  Reflex Scores:      Tricep reflexes are 2+ on the right side and 2+ on the left side.      Bicep reflexes are 2+ on the right side and 2+ on the left side.      Brachioradialis reflexes are 2+ on the right side and 2+ on the left side.      Patellar reflexes are 1+ on the right side and 2+ on the left side.      Achilles reflexes are 0 on the right side and 0 on the left side.      + SLR in RLE  Skin: Skin is warm and dry. No rash noted. She is not diaphoretic. No erythema. No pallor.  Psychiatric: She has a normal mood and affect. Her behavior is normal. Judgment and thought content normal.      Lab Results  Component Value Date   WBC 4.9 12/10/2011   HGB 12.6 12/10/2011   HCT 38.6 12/10/2011   PLT 340.0 12/10/2011   GLUCOSE 107* 12/10/2011   CHOL 134 12/10/2011   TRIG 64.0 12/10/2011   HDL 53.30 12/10/2011   LDLCALC 68 12/10/2011   ALT 21 12/10/2011   AST 23 12/10/2011   NA 141 12/10/2011   K 3.6 12/10/2011   CL 107 12/10/2011   CREATININE 0.9 12/10/2011   BUN 8 12/10/2011   CO2 28 12/10/2011   TSH 1.08 12/10/2011   HGBA1C 5.7 12/10/2011      Assessment & Plan:

## 2012-04-07 NOTE — Assessment & Plan Note (Signed)
Today she has worsening pain and abnormal neuro exam in her right leg so I have asked her to get an MRI done of her L/S spine and to see PT, she wants to take vicodin for the pain, she will continue nsaids

## 2012-04-11 ENCOUNTER — Other Ambulatory Visit: Payer: BC Managed Care – PPO

## 2012-04-13 ENCOUNTER — Other Ambulatory Visit: Payer: Self-pay | Admitting: Internal Medicine

## 2012-04-13 ENCOUNTER — Ambulatory Visit
Admission: RE | Admit: 2012-04-13 | Discharge: 2012-04-13 | Disposition: A | Discharge status: Home / Self Care | Payer: BC Managed Care – PPO | Source: Ambulatory Visit | Attending: Internal Medicine | Admitting: Internal Medicine

## 2012-04-13 DIAGNOSIS — M545 Low back pain: Secondary | ICD-10-CM

## 2012-04-13 DIAGNOSIS — M48061 Spinal stenosis, lumbar region without neurogenic claudication: Secondary | ICD-10-CM | POA: Insufficient documentation

## 2012-04-25 ENCOUNTER — Ambulatory Visit
Payer: BC Managed Care – PPO | Attending: Internal Medicine | Admitting: Rehabilitative and Restorative Service Providers"

## 2012-04-25 DIAGNOSIS — M545 Low back pain, unspecified: Secondary | ICD-10-CM | POA: Insufficient documentation

## 2012-04-25 DIAGNOSIS — M2560 Stiffness of unspecified joint, not elsewhere classified: Secondary | ICD-10-CM | POA: Insufficient documentation

## 2012-04-25 DIAGNOSIS — IMO0001 Reserved for inherently not codable concepts without codable children: Secondary | ICD-10-CM | POA: Insufficient documentation

## 2012-04-25 DIAGNOSIS — M2550 Pain in unspecified joint: Secondary | ICD-10-CM | POA: Insufficient documentation

## 2012-05-04 ENCOUNTER — Telehealth: Payer: Self-pay | Admitting: Internal Medicine

## 2012-05-04 ENCOUNTER — Ambulatory Visit: Payer: BC Managed Care – PPO | Admitting: Rehabilitative and Restorative Service Providers"

## 2012-05-04 DIAGNOSIS — M545 Low back pain: Secondary | ICD-10-CM

## 2012-05-04 MED ORDER — CYCLOBENZAPRINE HCL 5 MG PO TABS: 5.0000 mg | ORAL_TABLET | Freq: Three times a day (TID) | ORAL | Status: DC | PRN

## 2012-05-04 NOTE — Telephone Encounter (Signed)
Pt request rx for a muscle relaxer, please call into [facility] [address]. Please call pt once sent

## 2012-05-04 NOTE — Telephone Encounter (Signed)
done

## 2012-05-12 ENCOUNTER — Other Ambulatory Visit: Payer: Self-pay | Admitting: Internal Medicine

## 2012-05-12 ENCOUNTER — Ambulatory Visit: Payer: BC Managed Care – PPO | Admitting: Rehabilitative and Restorative Service Providers"

## 2012-05-12 NOTE — Telephone Encounter (Signed)
Caller: Kim Rowland/Patient; Phone: [phone]; Reason for Call: Please call pt for refill on Neurotin; pt uses [facility] at [address] and [address].

## 2012-05-12 NOTE — Telephone Encounter (Signed)
Pt not prescribed Gabapentin and advised of same via VM. Told to call back with name of medication needing refill.

## 2012-05-13 ENCOUNTER — Ambulatory Visit: Payer: BC Managed Care – PPO | Admitting: Rehabilitation

## 2012-05-16 ENCOUNTER — Ambulatory Visit: Payer: BC Managed Care – PPO | Admitting: Physical Therapy

## 2012-05-19 ENCOUNTER — Ambulatory Visit: Payer: BC Managed Care – PPO | Admitting: Physical Therapy

## 2012-05-24 ENCOUNTER — Ambulatory Visit: Payer: BC Managed Care – PPO | Attending: Internal Medicine | Admitting: Physical Therapy

## 2012-05-24 DIAGNOSIS — IMO0001 Reserved for inherently not codable concepts without codable children: Secondary | ICD-10-CM | POA: Insufficient documentation

## 2012-05-24 DIAGNOSIS — M545 Low back pain, unspecified: Secondary | ICD-10-CM | POA: Insufficient documentation

## 2012-05-24 DIAGNOSIS — M2560 Stiffness of unspecified joint, not elsewhere classified: Secondary | ICD-10-CM | POA: Insufficient documentation

## 2012-05-24 DIAGNOSIS — M2550 Pain in unspecified joint: Secondary | ICD-10-CM | POA: Insufficient documentation

## 2012-05-26 ENCOUNTER — Ambulatory Visit: Payer: BC Managed Care – PPO | Admitting: Physical Therapy

## 2012-05-30 ENCOUNTER — Ambulatory Visit: Payer: BC Managed Care – PPO | Admitting: Rehabilitative and Restorative Service Providers"

## 2012-05-31 ENCOUNTER — Other Ambulatory Visit: Payer: Self-pay | Admitting: Internal Medicine

## 2012-05-31 DIAGNOSIS — Z1231 Encounter for screening mammogram for malignant neoplasm of breast: Secondary | ICD-10-CM

## 2012-06-02 ENCOUNTER — Ambulatory Visit: Payer: BC Managed Care – PPO | Admitting: Physical Therapy

## 2012-06-07 ENCOUNTER — Ambulatory Visit: Payer: BC Managed Care – PPO | Admitting: Rehabilitative and Restorative Service Providers"

## 2012-06-09 ENCOUNTER — Ambulatory Visit: Payer: BC Managed Care – PPO | Admitting: Physical Therapy

## 2012-06-10 ENCOUNTER — Ambulatory Visit (HOSPITAL_COMMUNITY)
Admission: RE | Admit: 2012-06-10 | Discharge: 2012-06-10 | Disposition: A | Discharge status: Home / Self Care | Payer: BC Managed Care – PPO | Source: Ambulatory Visit | Attending: Internal Medicine | Admitting: Internal Medicine

## 2012-06-10 DIAGNOSIS — Z1231 Encounter for screening mammogram for malignant neoplasm of breast: Secondary | ICD-10-CM | POA: Insufficient documentation

## 2012-06-13 ENCOUNTER — Ambulatory Visit: Payer: BC Managed Care – PPO | Admitting: Physical Therapy

## 2012-06-13 LAB — HM MAMMOGRAPHY: HM Mammogram: ABNORMAL

## 2012-06-15 ENCOUNTER — Ambulatory Visit: Payer: BC Managed Care – PPO | Admitting: Physical Therapy

## 2012-06-15 ENCOUNTER — Other Ambulatory Visit: Payer: Self-pay | Admitting: Internal Medicine

## 2012-06-15 DIAGNOSIS — R928 Other abnormal and inconclusive findings on diagnostic imaging of breast: Secondary | ICD-10-CM

## 2012-06-16 ENCOUNTER — Ambulatory Visit
Admission: RE | Admit: 2012-06-16 | Discharge: 2012-06-16 | Disposition: A | Discharge status: Home / Self Care | Payer: BC Managed Care – PPO | Source: Ambulatory Visit | Attending: Internal Medicine | Admitting: Internal Medicine

## 2012-06-16 DIAGNOSIS — R928 Other abnormal and inconclusive findings on diagnostic imaging of breast: Secondary | ICD-10-CM

## 2012-06-21 ENCOUNTER — Ambulatory Visit
Payer: BC Managed Care – PPO | Attending: Internal Medicine | Admitting: Rehabilitative and Restorative Service Providers"

## 2012-06-21 DIAGNOSIS — M2550 Pain in unspecified joint: Secondary | ICD-10-CM | POA: Insufficient documentation

## 2012-06-21 DIAGNOSIS — M545 Low back pain, unspecified: Secondary | ICD-10-CM | POA: Insufficient documentation

## 2012-06-21 DIAGNOSIS — IMO0001 Reserved for inherently not codable concepts without codable children: Secondary | ICD-10-CM | POA: Insufficient documentation

## 2012-06-21 DIAGNOSIS — M2560 Stiffness of unspecified joint, not elsewhere classified: Secondary | ICD-10-CM | POA: Insufficient documentation

## 2012-06-23 ENCOUNTER — Ambulatory Visit: Payer: BC Managed Care – PPO | Admitting: Rehabilitative and Restorative Service Providers"

## 2012-06-28 ENCOUNTER — Ambulatory Visit: Payer: BC Managed Care – PPO | Admitting: Rehabilitative and Restorative Service Providers"

## 2012-06-30 ENCOUNTER — Ambulatory Visit: Payer: BC Managed Care – PPO | Admitting: Rehabilitative and Restorative Service Providers"

## 2012-07-07 ENCOUNTER — Encounter: Payer: Self-pay | Admitting: Internal Medicine

## 2012-07-07 ENCOUNTER — Ambulatory Visit (INDEPENDENT_AMBULATORY_CARE_PROVIDER_SITE_OTHER): Payer: BC Managed Care – PPO | Admitting: Internal Medicine

## 2012-07-07 VITALS — BP 136/86 | HR 72 | Temp 98.2°F | Resp 16 | Wt 146.5 lb

## 2012-07-07 DIAGNOSIS — M48061 Spinal stenosis, lumbar region without neurogenic claudication: Secondary | ICD-10-CM

## 2012-07-07 DIAGNOSIS — I10 Essential (primary) hypertension: Secondary | ICD-10-CM

## 2012-07-07 DIAGNOSIS — G4700 Insomnia, unspecified: Secondary | ICD-10-CM

## 2012-07-07 DIAGNOSIS — M545 Low back pain: Secondary | ICD-10-CM

## 2012-07-07 DIAGNOSIS — Z23 Encounter for immunization: Secondary | ICD-10-CM

## 2012-07-07 DIAGNOSIS — E785 Hyperlipidemia, unspecified: Secondary | ICD-10-CM

## 2012-07-07 MED ORDER — BUPRENORPHINE 15 MCG/HR TD PTWK: 1.0000 | MEDICATED_PATCH | TRANSDERMAL | Status: DC

## 2012-07-07 MED ORDER — EZETIMIBE-SIMVASTATIN 10-20 MG PO TABS: 1.0000 | ORAL_TABLET | Freq: Every day | ORAL | Status: AC

## 2012-07-07 MED ORDER — CYCLOBENZAPRINE HCL 5 MG PO TABS: 5.0000 mg | ORAL_TABLET | Freq: Three times a day (TID) | ORAL | Status: AC | PRN

## 2012-07-07 MED ORDER — ESZOPICLONE 3 MG PO TABS: 3.0000 mg | ORAL_TABLET | Freq: Every day | ORAL | Status: DC

## 2012-07-07 NOTE — Progress Notes (Signed)
Subjective:    Patient ID: Kim Rowland, female    DOB: [birth year], 66 y.o.   MRN: [id number]  Back Pain This is a chronic problem. The current episode started more than 1 year ago. The problem occurs constantly. The problem is unchanged. The pain is present in the lumbar spine. The quality of the pain is described as aching. The pain radiates to the right thigh. The pain is at a severity of 6/10. The pain is moderate. The pain is worse during the day. The symptoms are aggravated by position and bending. Associated symptoms include leg pain (right leg). Pertinent negatives include no abdominal pain, bladder incontinence, bowel incontinence, chest pain, dysuria, fever, headaches, numbness, paresis, paresthesias, pelvic pain, perianal numbness, tingling, weakness or weight loss. She has tried NSAIDs, analgesics and muscle relaxant (several steroid injections, has been seeing Dr. Danielle Dess) for the symptoms. The treatment provided mild relief.      Review of Systems  Constitutional: Negative for fever, chills, weight loss, diaphoresis, activity change, appetite change, fatigue and unexpected weight change.  HENT: Negative.   Eyes: Negative.   Respiratory: Negative for cough, chest tightness, shortness of breath, wheezing and stridor.   Cardiovascular: Negative for chest pain, palpitations and leg swelling.  Gastrointestinal: Negative for nausea, vomiting, abdominal pain, diarrhea, constipation and bowel incontinence.  Genitourinary: Negative.  Negative for bladder incontinence, dysuria and pelvic pain.  Musculoskeletal: Positive for back pain. Negative for myalgias, joint swelling, arthralgias and gait problem.  Skin: Negative for color change, pallor, rash and wound.  Neurological: Negative.  Negative for tingling, weakness, numbness, headaches and paresthesias.  Hematological: Negative for adenopathy. Does not bruise/bleed easily.  Psychiatric/Behavioral: Positive for disturbed wake/sleep  cycle. Negative for suicidal ideas, hallucinations, behavioral problems, confusion, self-injury, dysphoric mood, decreased concentration and agitation. The patient is not nervous/anxious and is not hyperactive.        Objective:   Physical Exam  Constitutional: She is oriented to person, place, and time. She appears well-developed and well-nourished. No distress.  HENT:  Head: Normocephalic and atraumatic.  Mouth/Throat: Oropharynx is clear and moist. No oropharyngeal exudate.  Eyes: Conjunctivae normal are normal. Right eye exhibits no discharge. Left eye exhibits no discharge. No scleral icterus.  Neck: Normal range of motion. Neck supple. No JVD present. No tracheal deviation present. No thyromegaly present.  Cardiovascular: Normal rate, regular rhythm, normal heart sounds and intact distal pulses.  Exam reveals no gallop and no friction rub.   No murmur heard. Pulmonary/Chest: Effort normal and breath sounds normal. No respiratory distress. She has no wheezes. She has no rales. She exhibits no tenderness.  Abdominal: Soft. Bowel sounds are normal. She exhibits no distension and no mass. There is no tenderness. There is no rebound and no guarding.  Musculoskeletal: Normal range of motion. She exhibits no edema and no tenderness.       Lumbar back: Normal. She exhibits normal range of motion, no tenderness, no bony tenderness, no swelling, no edema, no deformity, no laceration, no pain, no spasm and normal pulse.  Lymphadenopathy:    She has no cervical adenopathy.  Neurological: She is alert and oriented to person, place, and time. She has normal strength. She displays no atrophy, no tremor and normal reflexes. No cranial nerve deficit or sensory deficit. She exhibits normal muscle tone. She displays a negative Romberg sign. She displays no seizure activity. Coordination and gait normal. She displays no Babinski's sign on the right side. She displays no Babinski's sign on the left side.  Reflex Scores:      Tricep reflexes are 1+ on the right side and 1+ on the left side.      Bicep reflexes are 1+ on the right side and 1+ on the left side.      Brachioradialis reflexes are 1+ on the right side and 1+ on the left side.      Patellar reflexes are 1+ on the right side and 1+ on the left side.      Achilles reflexes are 1+ on the right side and 1+ on the left side. Skin: Skin is warm and dry. No rash noted. She is not diaphoretic. No erythema. No pallor.  Psychiatric: She has a normal mood and affect. Her behavior is normal. Judgment and thought content normal.     Lab Results  Component Value Date   WBC 4.9 12/10/2011   HGB 12.6 12/10/2011   HCT 38.6 12/10/2011   PLT 340.0 12/10/2011   GLUCOSE 107* 12/10/2011   CHOL 134 12/10/2011   TRIG 64.0 12/10/2011   HDL 53.30 12/10/2011   LDLCALC 68 12/10/2011   ALT 21 12/10/2011   AST 23 12/10/2011   NA 141 12/10/2011   K 3.6 12/10/2011   CL 107 12/10/2011   CREATININE 0.9 12/10/2011   BUN 8 12/10/2011   CO2 28 12/10/2011   TSH 1.08 12/10/2011   HGBA1C 5.7 12/10/2011       Assessment & Plan:

## 2012-07-07 NOTE — Patient Instructions (Signed)
Back Pain, Adult Low back pain is very common. About 1 in 5 people have back pain.The cause of low back pain is rarely dangerous. The pain often gets better over time.About half of people with a sudden onset of back pain feel better in just 2 weeks. About 8 in 10 people feel better by 6 weeks.  CAUSES Some common causes of back pain include:  Strain of the muscles or ligaments supporting the spine.  Wear and tear (degeneration) of the spinal discs.  Arthritis.  Direct injury to the back. DIAGNOSIS Most of the time, the direct cause of low back pain is not known.However, back pain can be treated effectively even when the exact cause of the pain is unknown.Answering your caregiver's questions about your overall health and symptoms is one of the most accurate ways to make sure the cause of your pain is not dangerous. If your caregiver needs more information, he or she may order lab work or imaging tests (X-rays or MRIs).However, even if imaging tests show changes in your back, this usually does not require surgery. HOME CARE INSTRUCTIONS For many people, back pain returns.Since low back pain is rarely dangerous, it is often a condition that people can learn to manageon their own.   Remain active. It is stressful on the back to sit or stand in one place. Do not sit, drive, or stand in one place for more than 30 minutes at a time. Take short walks on level surfaces as soon as pain allows.Try to increase the length of time you walk each day.  Do not stay in bed.Resting more than 1 or 2 days can delay your recovery.  Do not avoid exercise or work.Your body is made to move.It is not dangerous to be active, even though your back may hurt.Your back will likely heal faster if you return to being active before your pain is gone.  Pay attention to your body when you bend and lift. Many people have less discomfortwhen lifting if they bend their knees, keep the load close to their bodies,and  avoid twisting. Often, the most comfortable positions are those that put less stress on your recovering back.  Find a comfortable position to sleep. Use a firm mattress and lie on your side with your knees slightly bent. If you lie on your back, put a pillow under your knees.  Only take over-the-counter or prescription medicines as directed by your caregiver. Over-the-counter medicines to reduce pain and inflammation are often the most helpful.Your caregiver may prescribe muscle relaxant drugs.These medicines help dull your pain so you can more quickly return to your normal activities and healthy exercise.  Put ice on the injured area.  Put ice in a plastic bag.  Place a towel between your skin and the bag.  Leave the ice on for 15 to 20 minutes, 3 to 4 times a day for the first 2 to 3 days. After that, ice and heat may be alternated to reduce pain and spasms.  Ask your caregiver about trying back exercises and gentle massage. This may be of some benefit.  Avoid feeling anxious or stressed.Stress increases muscle tension and can worsen back pain.It is important to recognize when you are anxious or stressed and learn ways to manage it.Exercise is a great option. SEEK MEDICAL CARE IF:  You have pain that is not relieved with rest or medicine.  You have pain that does not improve in 1 week.  You have new symptoms.  You are generally   not feeling well. SEEK IMMEDIATE MEDICAL CARE IF:   You have pain that radiates from your back into your legs.  You develop new bowel or bladder control problems.  You have unusual weakness or numbness in your arms or legs.  You develop nausea or vomiting.  You develop abdominal pain.  You feel faint. Document Released: 09/07/2005 Document Revised: 03/08/2012 Document Reviewed: 01/26/2011 [employer] Patient Information 2013 [employer].  

## 2012-07-08 NOTE — Assessment & Plan Note (Signed)
Try Butrans patch for pain, refer to NS at [facility], continue other meds as needed

## 2012-07-08 NOTE — Assessment & Plan Note (Signed)
Neurosurgery referral.

## 2012-07-08 NOTE — Assessment & Plan Note (Signed)
Her BP is well controlled 

## 2012-09-22 ENCOUNTER — Ambulatory Visit (INDEPENDENT_AMBULATORY_CARE_PROVIDER_SITE_OTHER): Payer: BC Managed Care – PPO | Admitting: Internal Medicine

## 2012-09-22 ENCOUNTER — Encounter: Payer: Self-pay | Admitting: Internal Medicine

## 2012-09-22 VITALS — BP 140/68 | HR 90 | Temp 98.1°F | Resp 16

## 2012-09-22 DIAGNOSIS — M545 Low back pain: Secondary | ICD-10-CM

## 2012-09-22 DIAGNOSIS — I10 Essential (primary) hypertension: Secondary | ICD-10-CM

## 2012-09-22 DIAGNOSIS — M961 Postlaminectomy syndrome, not elsewhere classified: Secondary | ICD-10-CM

## 2012-09-22 DIAGNOSIS — E785 Hyperlipidemia, unspecified: Secondary | ICD-10-CM

## 2012-09-22 DIAGNOSIS — M48061 Spinal stenosis, lumbar region without neurogenic claudication: Secondary | ICD-10-CM

## 2012-09-22 NOTE — Progress Notes (Signed)
Subjective:    Patient ID: Kim Rowland, female    DOB: 02/22/1946, 67 y.o.   MRN: [id number]  Back Pain This is a recurrent problem. The current episode started more than 1 year ago. The problem occurs intermittently. The problem has been gradually improving since onset. The pain is present in the lumbar spine. The quality of the pain is described as aching. The pain radiates to the right thigh. The pain is at a severity of 3/10. The pain is mild. The pain is the same all the time. The symptoms are aggravated by position and bending. Stiffness is present all day. Pertinent negatives include no abdominal pain, bladder incontinence, bowel incontinence, chest pain, dysuria, fever, headaches, leg pain, numbness, paresis, paresthesias, pelvic pain, perianal numbness, tingling, weakness or weight loss. She has tried analgesics (she had surgery 6 weeks ago) for the symptoms. The treatment provided significant relief.      Review of Systems  Constitutional: Negative for fever, chills, weight loss, diaphoresis, activity change, appetite change, fatigue and unexpected weight change.  HENT: Negative.   Eyes: Negative.   Respiratory: Negative for cough, chest tightness, shortness of breath, wheezing and stridor.   Cardiovascular: Negative for chest pain, palpitations and leg swelling.  Gastrointestinal: Negative for nausea, vomiting, abdominal pain, diarrhea, constipation, blood in stool and bowel incontinence.  Genitourinary: Negative.  Negative for bladder incontinence, dysuria and pelvic pain.  Musculoskeletal: Positive for back pain. Negative for myalgias, joint swelling, arthralgias and gait problem.  Skin: Negative for color change, pallor, rash and wound.  Neurological: Negative for dizziness, tingling, tremors, seizures, syncope, facial asymmetry, speech difficulty, weakness, light-headedness, numbness, headaches and paresthesias.  Hematological: Negative for adenopathy. Does not bruise/bleed  easily.  Psychiatric/Behavioral: Negative.        Objective:   Physical Exam  Vitals reviewed. Constitutional: She is oriented to person, place, and time. She appears well-developed and well-nourished. No distress.  HENT:  Head: Normocephalic and atraumatic.  Mouth/Throat: Oropharynx is clear and moist. No oropharyngeal exudate.  Eyes: Conjunctivae normal are normal. Right eye exhibits no discharge. Left eye exhibits no discharge. No scleral icterus.  Neck: Normal range of motion. Neck supple. No JVD present. No tracheal deviation present. No thyromegaly present.  Cardiovascular: Normal rate, regular rhythm, normal heart sounds and intact distal pulses.  Exam reveals no gallop and no friction rub.   No murmur heard. Pulmonary/Chest: Effort normal and breath sounds normal. No stridor. No respiratory distress. She has no wheezes. She has no rales. She exhibits no tenderness.  Abdominal: Soft. Bowel sounds are normal. She exhibits no distension and no mass. There is no tenderness. There is no rebound and no guarding.  Musculoskeletal: Normal range of motion. She exhibits no edema and no tenderness.       Lumbar back: She exhibits deformity. She exhibits normal range of motion, no tenderness, no bony tenderness, no swelling, no edema, no laceration, no pain, no spasm and normal pulse.       Back:  Lymphadenopathy:    She has no cervical adenopathy.  Neurological: She is alert and oriented to person, place, and time. She has normal strength. She displays no atrophy, no tremor and normal reflexes. No cranial nerve deficit or sensory deficit. She exhibits normal muscle tone. She displays a negative Romberg sign. She displays no seizure activity. Coordination and gait normal. She displays no Babinski's sign on the right side. She displays no Babinski's sign on the left side.  Reflex Scores:  Tricep reflexes are 1+ on the right side and 1+ on the left side.      Bicep reflexes are 1+ on the  right side.      Brachioradialis reflexes are 1+ on the right side and 1+ on the left side.      Patellar reflexes are 1+ on the right side and 1+ on the left side.      Achilles reflexes are 1+ on the right side and 1+ on the left side.      -SLR in BLE  Skin: Skin is warm and dry. No rash noted. She is not diaphoretic. No erythema. No pallor.  Psychiatric: She has a normal mood and affect. Her behavior is normal. Judgment and thought content normal.      Lab Results  Component Value Date   WBC 4.9 12/10/2011   HGB 12.6 12/10/2011   HCT 38.6 12/10/2011   PLT 340.0 12/10/2011   GLUCOSE 107* 12/10/2011   CHOL 134 12/10/2011   TRIG 64.0 12/10/2011   HDL 53.30 12/10/2011   LDLCALC 68 12/10/2011   ALT 21 12/10/2011   AST 23 12/10/2011   NA 141 12/10/2011   K 3.6 12/10/2011   CL 107 12/10/2011   CREATININE 0.9 12/10/2011   BUN 8 12/10/2011   CO2 28 12/10/2011   TSH 1.08 12/10/2011   HGBA1C 5.7 12/10/2011      Assessment & Plan:

## 2012-09-22 NOTE — Patient Instructions (Signed)
Back Pain, Adult Low back pain is very common. About 1 in 5 people have back pain.The cause of low back pain is rarely dangerous. The pain often gets better over time.About half of people with a sudden onset of back pain feel better in just 2 weeks. About 8 in 10 people feel better by 6 weeks.  CAUSES Some common causes of back pain include:  Strain of the muscles or ligaments supporting the spine.  Wear and tear (degeneration) of the spinal discs.  Arthritis.  Direct injury to the back. DIAGNOSIS Most of the time, the direct cause of low back pain is not known.However, back pain can be treated effectively even when the exact cause of the pain is unknown.Answering your caregiver's questions about your overall health and symptoms is one of the most accurate ways to make sure the cause of your pain is not dangerous. If your caregiver needs more information, he or she may order lab work or imaging tests (X-rays or MRIs).However, even if imaging tests show changes in your back, this usually does not require surgery. HOME CARE INSTRUCTIONS For many people, back pain returns.Since low back pain is rarely dangerous, it is often a condition that people can learn to manageon their own.   Remain active. It is stressful on the back to sit or stand in one place. Do not sit, drive, or stand in one place for more than 30 minutes at a time. Take short walks on level surfaces as soon as pain allows.Try to increase the length of time you walk each day.  Do not stay in bed.Resting more than 1 or 2 days can delay your recovery.  Do not avoid exercise or work.Your body is made to move.It is not dangerous to be active, even though your back may hurt.Your back will likely heal faster if you return to being active before your pain is gone.  Pay attention to your body when you bend and lift. Many people have less discomfortwhen lifting if they bend their knees, keep the load close to their bodies,and  avoid twisting. Often, the most comfortable positions are those that put less stress on your recovering back.  Find a comfortable position to sleep. Use a firm mattress and lie on your side with your knees slightly bent. If you lie on your back, put a pillow under your knees.  Only take over-the-counter or prescription medicines as directed by your caregiver. Over-the-counter medicines to reduce pain and inflammation are often the most helpful.Your caregiver may prescribe muscle relaxant drugs.These medicines help dull your pain so you can more quickly return to your normal activities and healthy exercise.  Put ice on the injured area.  Put ice in a plastic bag.  Place a towel between your skin and the bag.  Leave the ice on for 15 to 20 minutes, 3 to 4 times a day for the first 2 to 3 days. After that, ice and heat may be alternated to reduce pain and spasms.  Ask your caregiver about trying back exercises and gentle massage. This may be of some benefit.  Avoid feeling anxious or stressed.Stress increases muscle tension and can worsen back pain.It is important to recognize when you are anxious or stressed and learn ways to manage it.Exercise is a great option. SEEK MEDICAL CARE IF:  You have pain that is not relieved with rest or medicine.  You have pain that does not improve in 1 week.  You have new symptoms.  You are generally   not feeling well. SEEK IMMEDIATE MEDICAL CARE IF:   You have pain that radiates from your back into your legs.  You develop new bowel or bladder control problems.  You have unusual weakness or numbness in your arms or legs.  You develop nausea or vomiting.  You develop abdominal pain.  You feel faint. Document Released: 09/07/2005 Document Revised: 03/08/2012 Document Reviewed: 01/26/2011 [employer] Patient Information 2013 [employer].  

## 2012-09-22 NOTE — Assessment & Plan Note (Signed)
She will start outpt PT and will continue her current meds

## 2012-09-22 NOTE — Assessment & Plan Note (Signed)
She wants to do some outpt PT

## 2012-09-22 NOTE — Assessment & Plan Note (Signed)
Her BP is well controlled 

## 2012-10-04 ENCOUNTER — Ambulatory Visit: Payer: BC Managed Care – PPO | Admitting: Physical Therapy

## 2012-10-04 ENCOUNTER — Ambulatory Visit: Payer: BC Managed Care – PPO | Attending: Internal Medicine | Admitting: Physical Therapy

## 2012-10-04 DIAGNOSIS — M545 Low back pain, unspecified: Secondary | ICD-10-CM | POA: Insufficient documentation

## 2012-10-04 DIAGNOSIS — M2560 Stiffness of unspecified joint, not elsewhere classified: Secondary | ICD-10-CM | POA: Insufficient documentation

## 2012-10-04 DIAGNOSIS — M2550 Pain in unspecified joint: Secondary | ICD-10-CM | POA: Insufficient documentation

## 2012-10-04 DIAGNOSIS — IMO0001 Reserved for inherently not codable concepts without codable children: Secondary | ICD-10-CM | POA: Insufficient documentation

## 2012-10-06 ENCOUNTER — Ambulatory Visit: Payer: BC Managed Care – PPO | Admitting: Physical Therapy

## 2012-10-11 ENCOUNTER — Ambulatory Visit: Payer: BC Managed Care – PPO | Admitting: Physical Therapy

## 2012-10-13 ENCOUNTER — Ambulatory Visit: Payer: BC Managed Care – PPO | Admitting: Rehabilitation

## 2012-10-18 ENCOUNTER — Ambulatory Visit: Payer: BC Managed Care – PPO | Admitting: Physical Therapy

## 2012-10-20 ENCOUNTER — Ambulatory Visit: Payer: BC Managed Care – PPO | Admitting: Physical Therapy

## 2012-10-25 ENCOUNTER — Ambulatory Visit: Payer: BC Managed Care – PPO | Admitting: Physical Therapy

## 2012-10-25 ENCOUNTER — Other Ambulatory Visit: Payer: Self-pay

## 2012-10-25 DIAGNOSIS — G4700 Insomnia, unspecified: Secondary | ICD-10-CM

## 2012-10-25 MED ORDER — ESZOPICLONE 3 MG PO TABS: 3.0000 mg | ORAL_TABLET | Freq: Every day | ORAL | Status: AC

## 2012-10-25 NOTE — Telephone Encounter (Signed)
yes

## 2012-10-25 NOTE — Telephone Encounter (Signed)
Please advise if ok to refill Lunesta 2 mg tabs. Thanks

## 2012-10-27 ENCOUNTER — Ambulatory Visit: Payer: BC Managed Care – PPO | Admitting: Physical Therapy

## 2012-11-06 IMAGING — MG MM DIGITAL SCREENING BILAT W/ CAD
6 series · 6 of 6 positions shown · non-contrast
Comparison: none

[R CC]
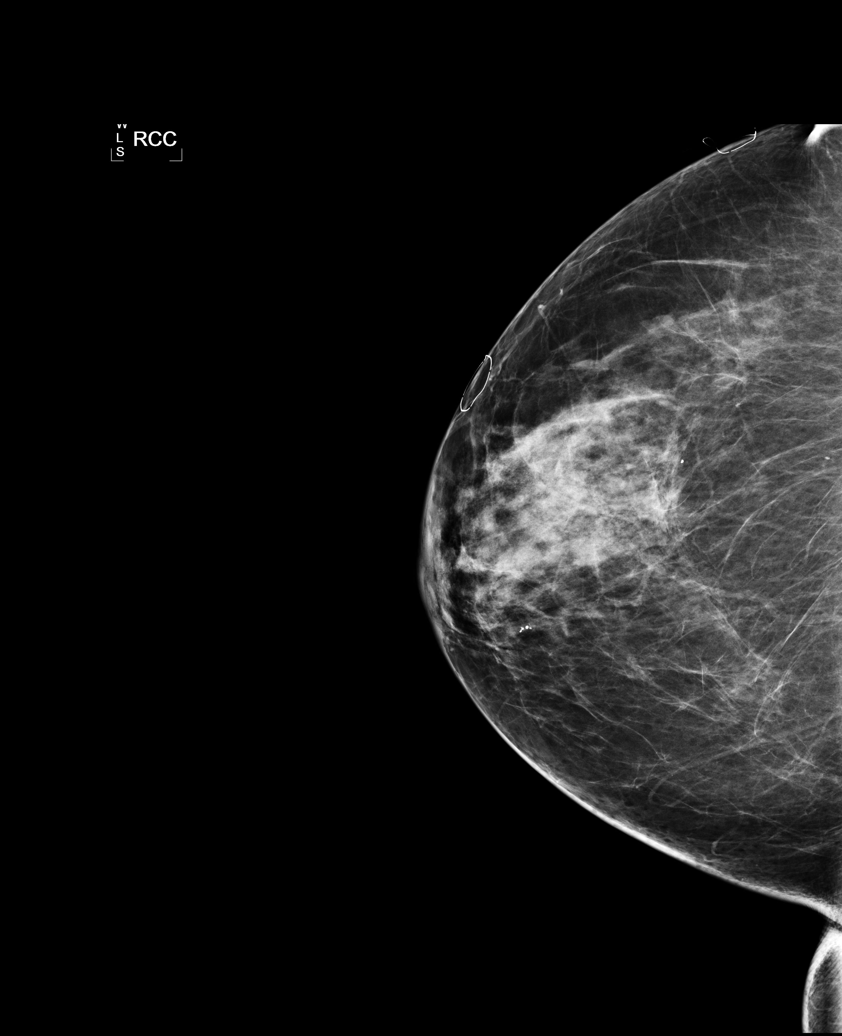

[R MLO (1 of 2)]
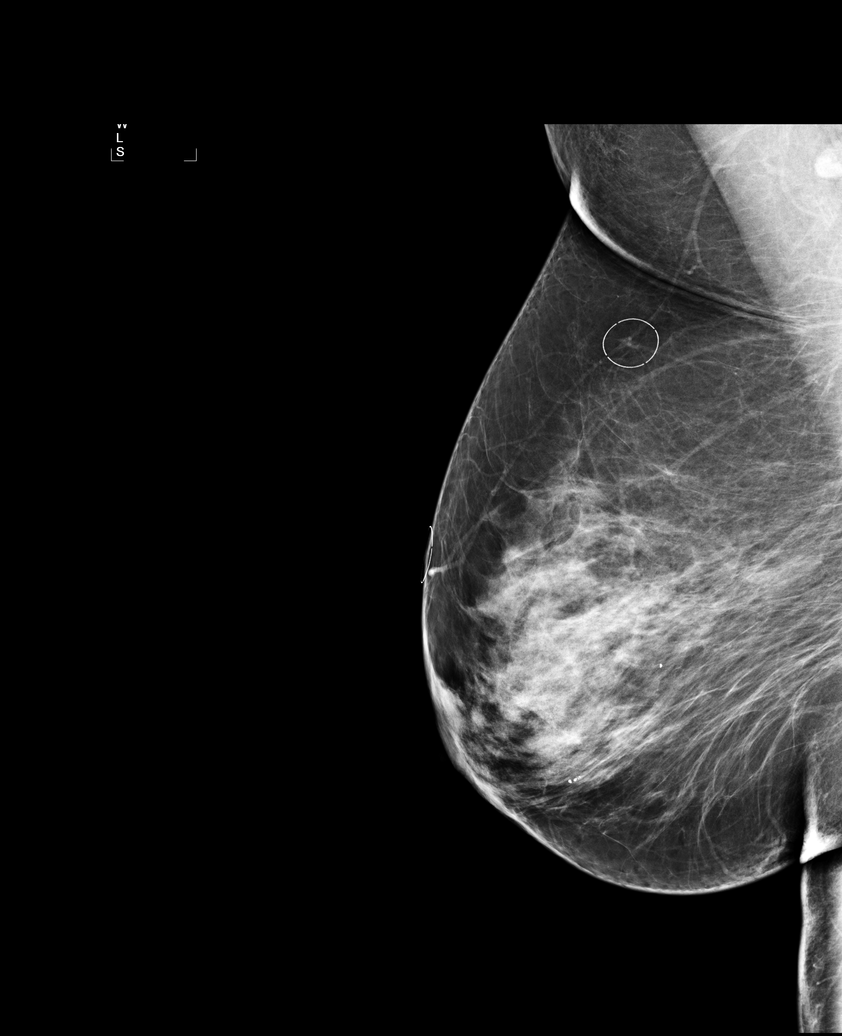

[L CC]
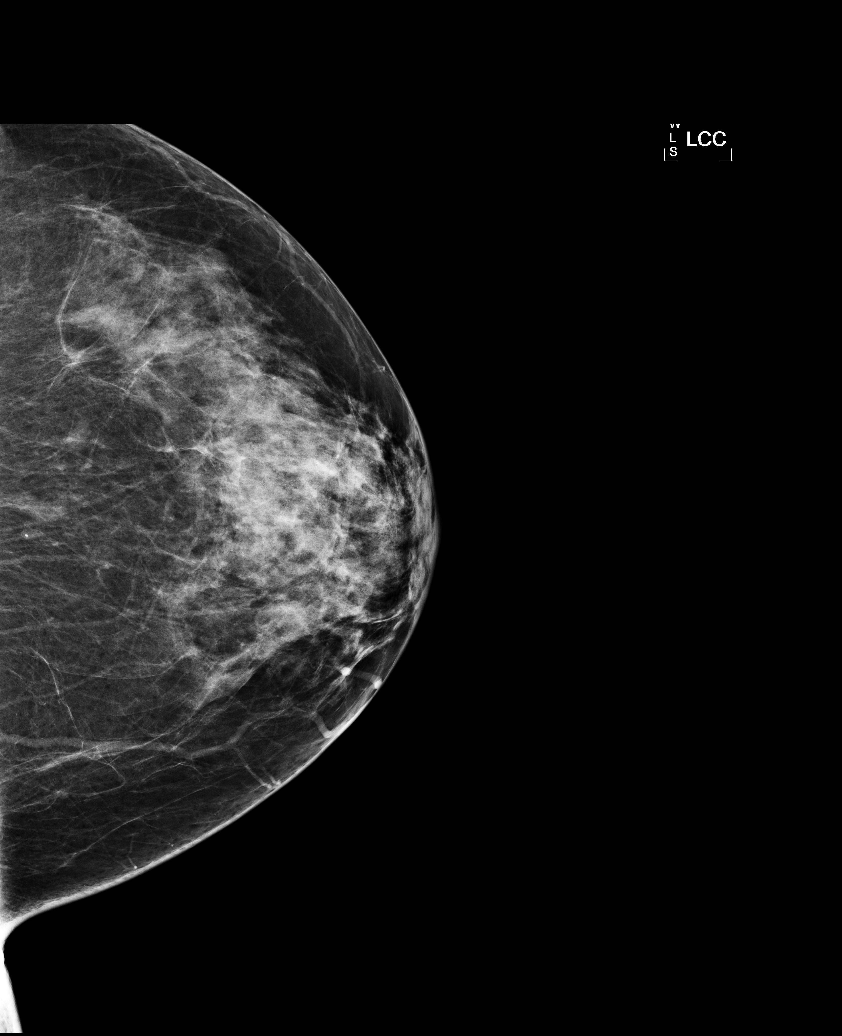

[L MLO (1 of 2)]
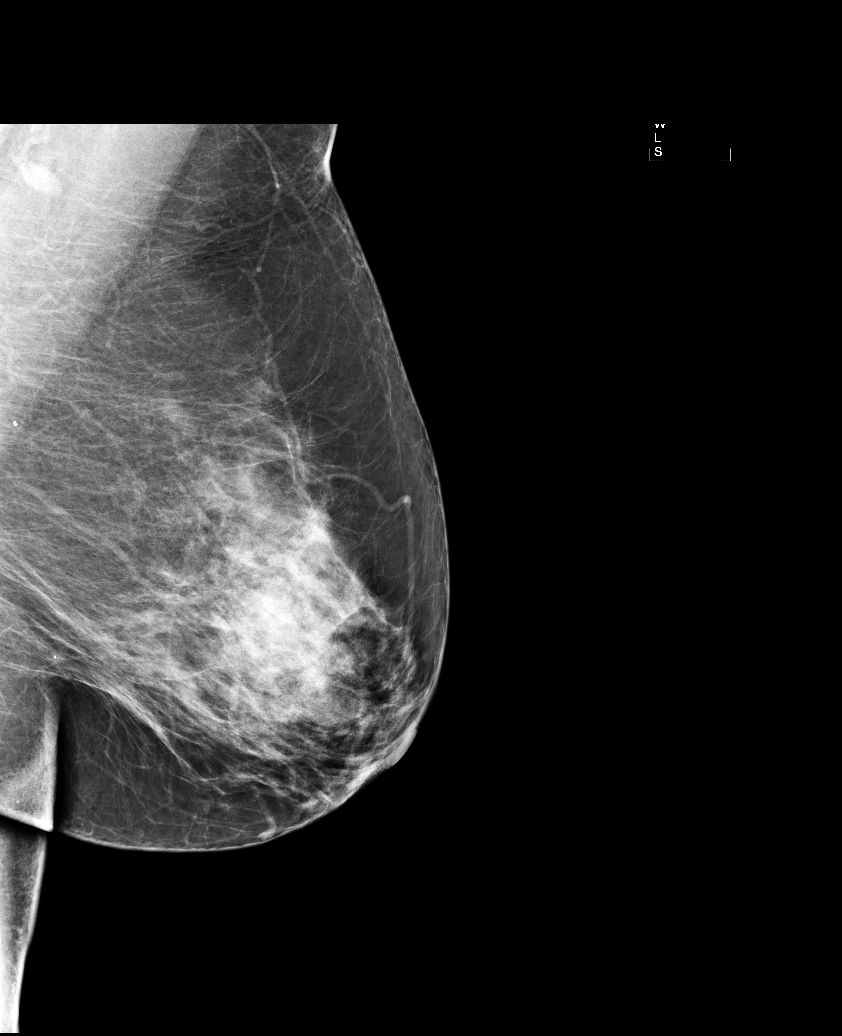

[L MLO (2 of 2)]
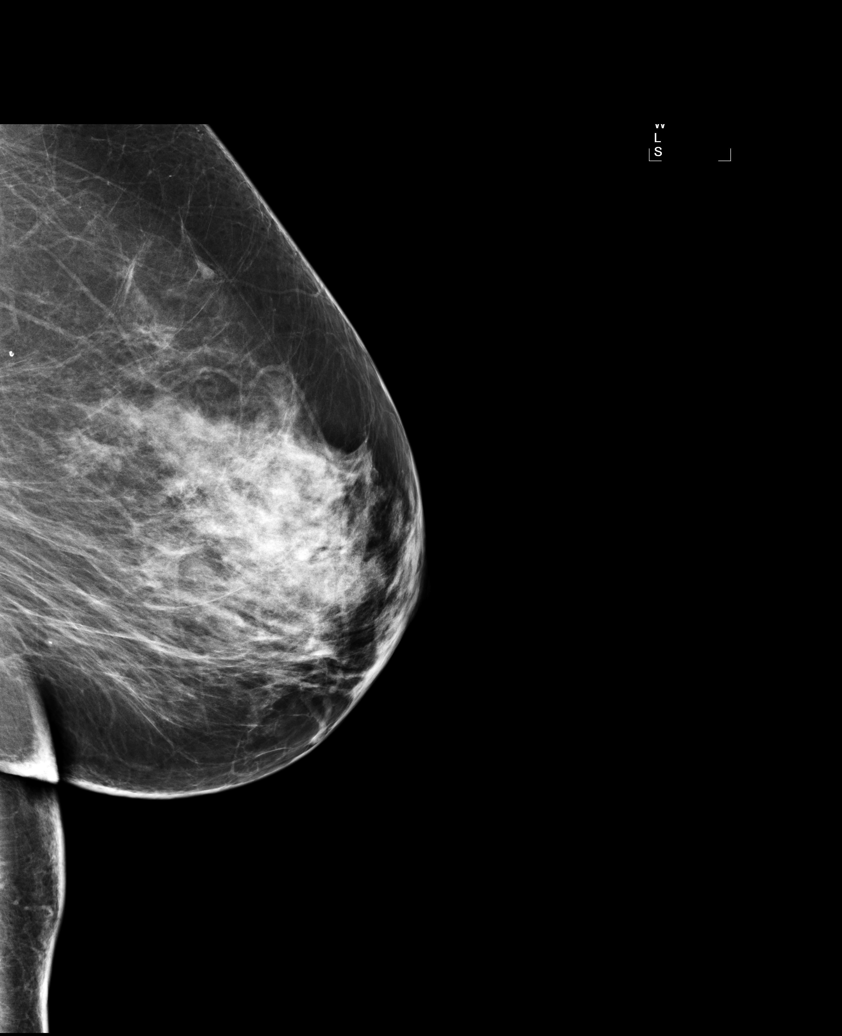

[R MLO (2 of 2)]
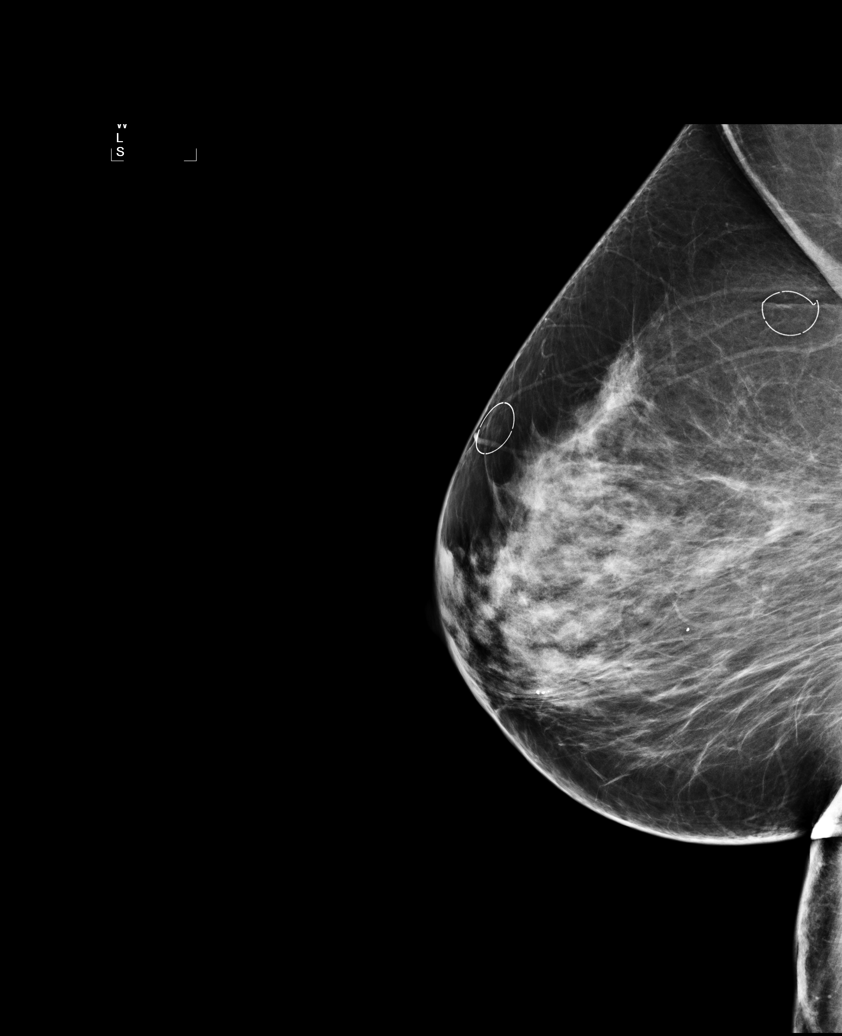

[6 of 6 positions shown; findings below may reference images not displayed]

Canned report from images found in remote index.

Refer to host system for actual result text.

## 2012-11-07 IMAGING — CR DG CHEST 2V
2 series · 2 of 2 positions shown · non-contrast
Comparison: 09/08/2010

CLINICAL DATA: Cough.  Pneumonia.

CHEST - 2 VIEW

[view not recorded (1 of 2)]
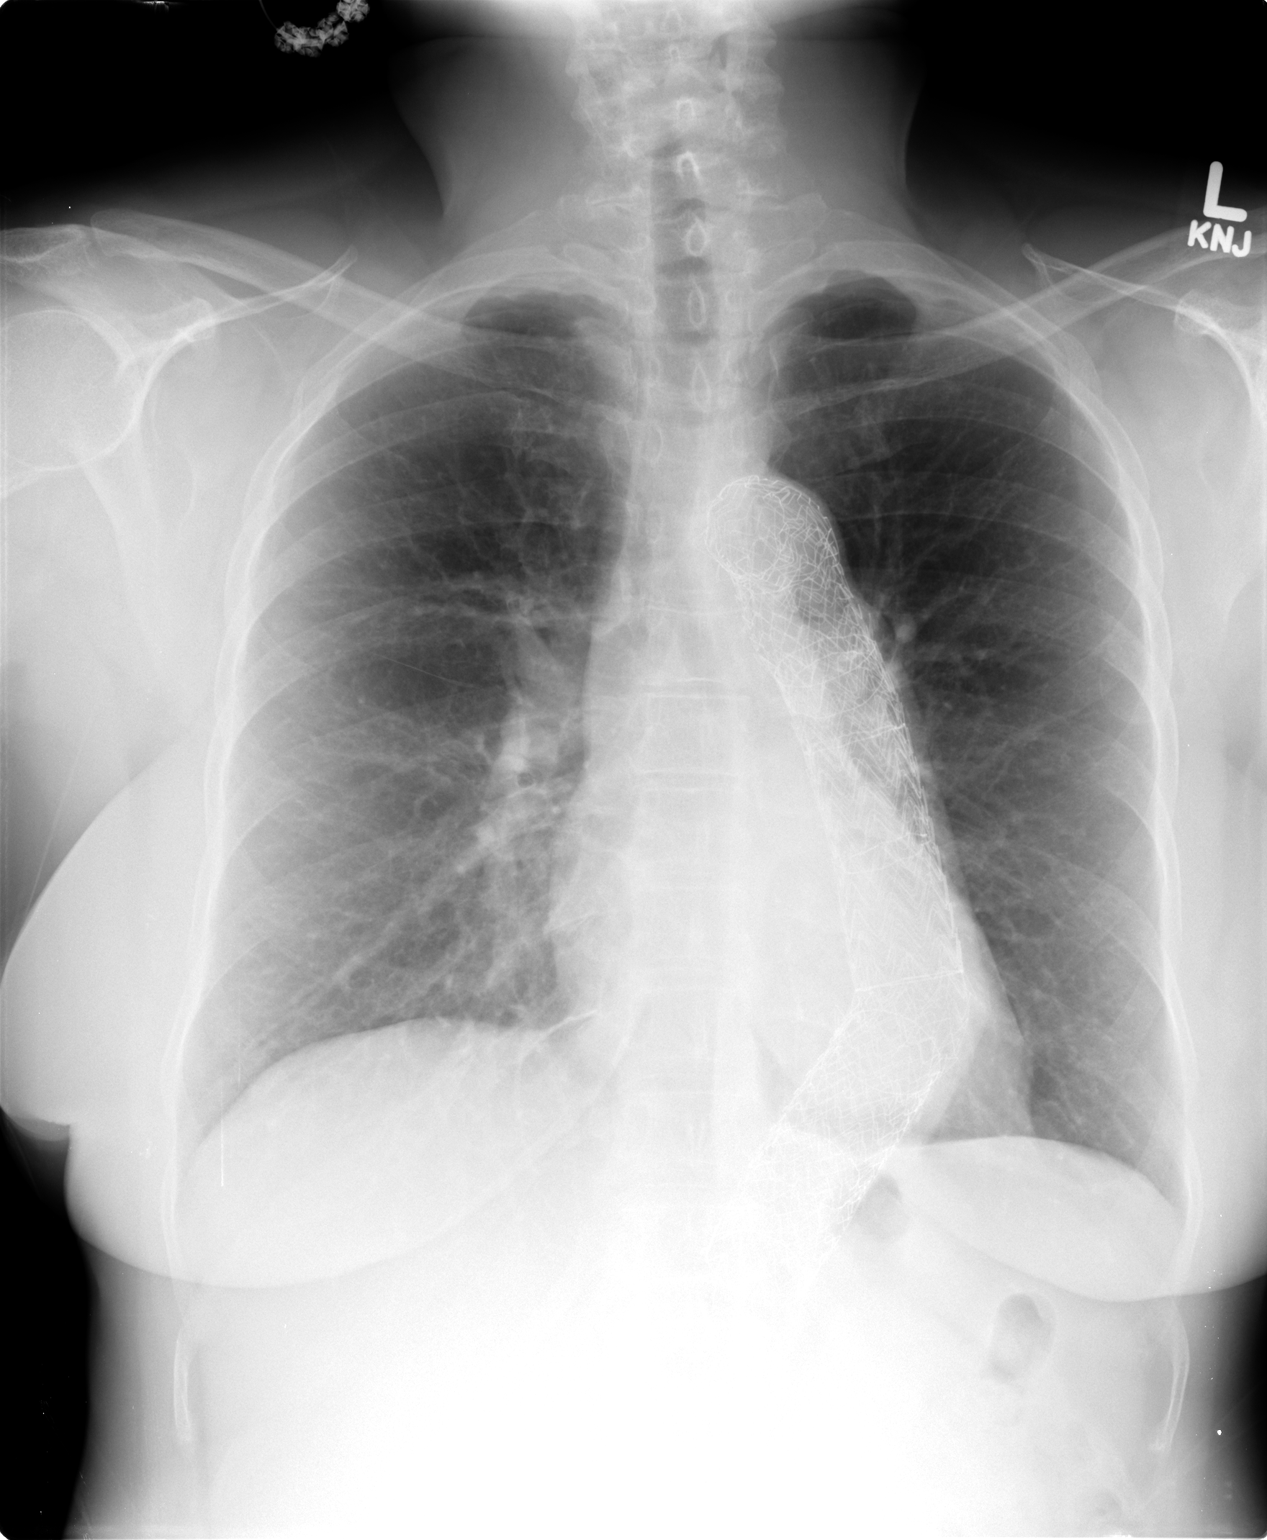

[view not recorded (2 of 2)]
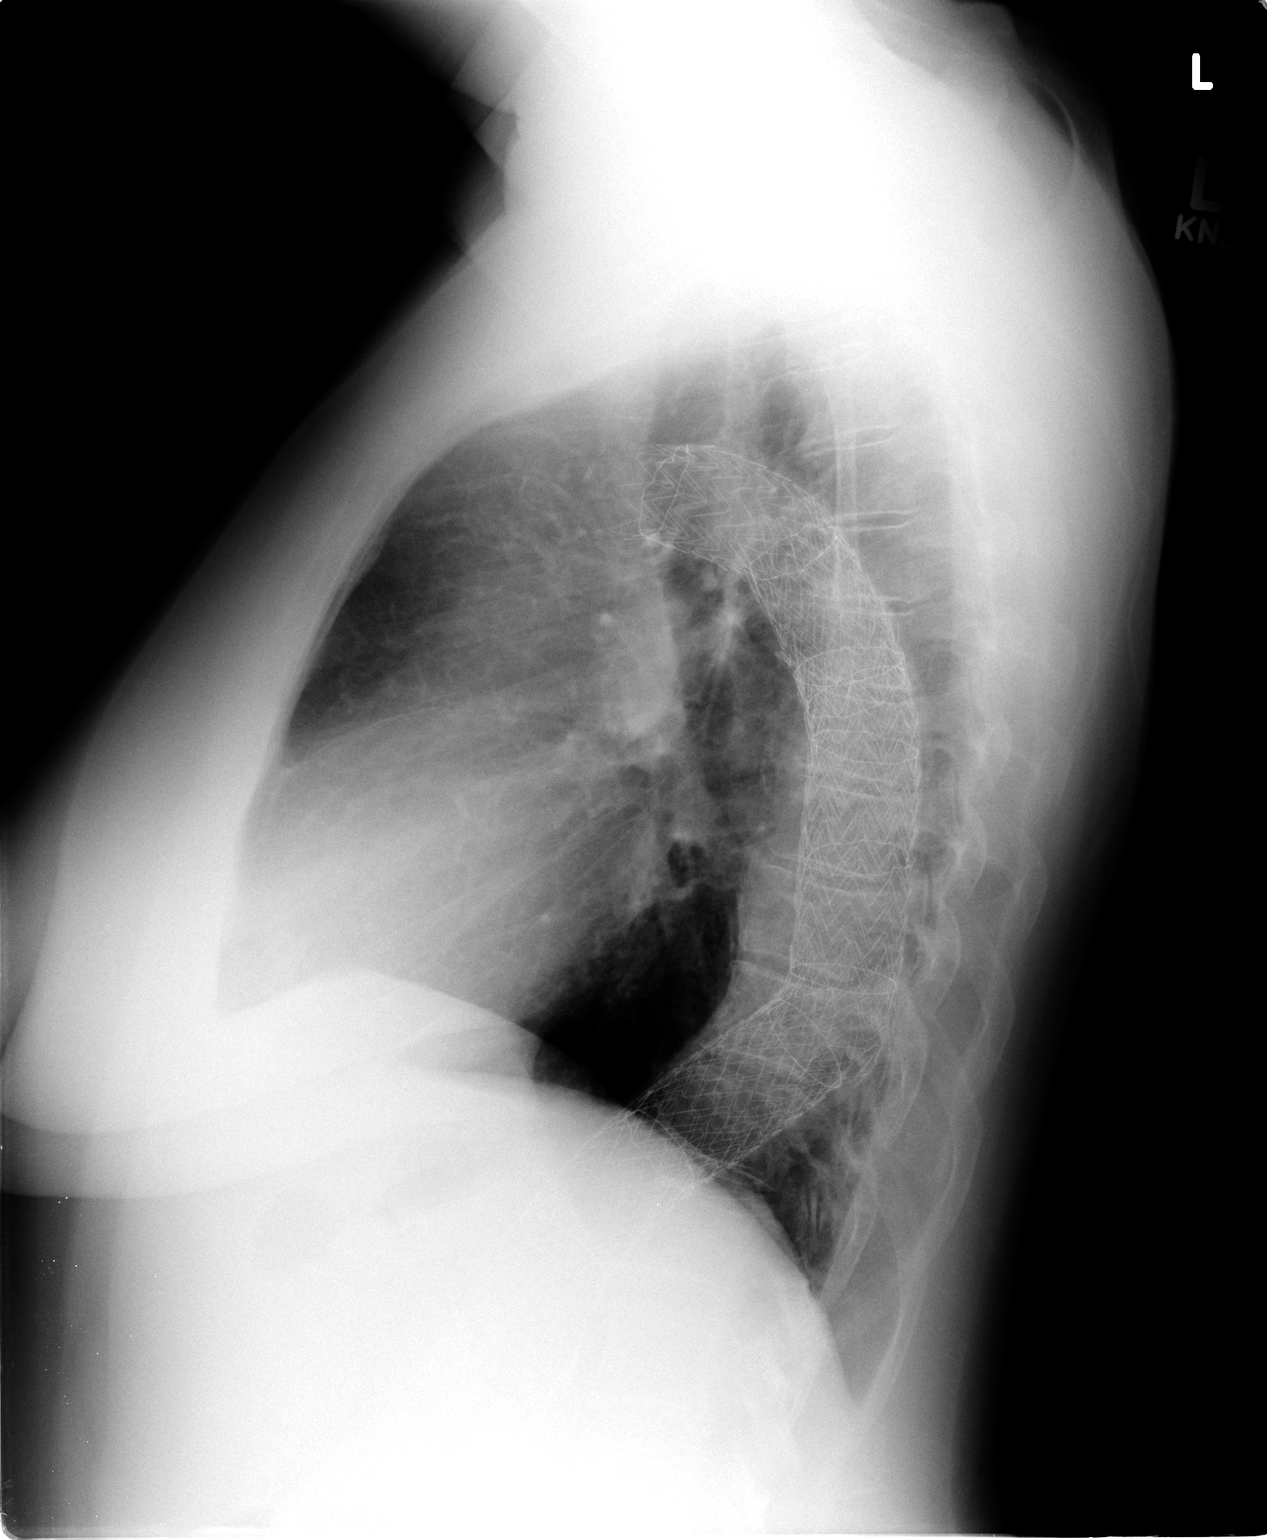

[2 of 2 positions shown; findings below may reference images not displayed]

FINDINGS: The lungs are clear without focal infiltrate, edema,
pneumothorax or pleural effusion. The cardiopericardial silhouette
is within normal limits for size.  Thoracic aortic stent graft is
evident. Imaged bony structures of the thorax are intact.
IMPRESSION: Stable exam.  No acute cardiopulmonary process.

## 2012-11-21 ENCOUNTER — Telehealth: Payer: Self-pay | Admitting: Internal Medicine

## 2012-11-21 NOTE — Telephone Encounter (Signed)
Pt is out of Amlodipine.  She needs a 14 day supply sent to [facility] at [facility] [address].  She needs a 90 day with refills sent to [facility].

## 2012-11-22 MED ORDER — AMLODIPINE BESYLATE 10 MG PO TABS: ORAL_TABLET | ORAL | Status: DC

## 2012-11-22 MED ORDER — AMLODIPINE BESYLATE 10 MG PO TABS: ORAL_TABLET | ORAL | Status: AC

## 2012-12-05 ENCOUNTER — Other Ambulatory Visit: Payer: Self-pay | Admitting: Internal Medicine

## 2012-12-05 ENCOUNTER — Other Ambulatory Visit: Payer: Self-pay | Admitting: *Deleted

## 2012-12-05 ENCOUNTER — Ambulatory Visit
Admission: RE | Admit: 2012-12-05 | Discharge: 2012-12-05 | Disposition: A | Discharge status: Home / Self Care | Payer: Medicare Other | Source: Ambulatory Visit | Attending: *Deleted | Admitting: *Deleted

## 2012-12-05 DIAGNOSIS — IMO0002 Reserved for concepts with insufficient information to code with codable children: Secondary | ICD-10-CM

## 2012-12-05 MED ORDER — GADOBENATE DIMEGLUMINE 529 MG/ML IV SOLN
15.0000 mL | Freq: Once | INTRAVENOUS | Status: AC | PRN
  Administered 2012-12-05: 15 mL via INTRAVENOUS

## 2013-01-25 ENCOUNTER — Ambulatory Visit: Payer: BC Managed Care – PPO | Admitting: Internal Medicine

## 2013-09-29 ENCOUNTER — Other Ambulatory Visit: Payer: Self-pay | Admitting: Internal Medicine

## 2013-10-05 ENCOUNTER — Other Ambulatory Visit: Payer: Self-pay | Admitting: Internal Medicine

## 2013-12-28 ENCOUNTER — Other Ambulatory Visit: Payer: Self-pay | Admitting: Internal Medicine

## 2014-02-07 ENCOUNTER — Emergency Department (INDEPENDENT_AMBULATORY_CARE_PROVIDER_SITE_OTHER)
Admission: EM | Admit: 2014-02-07 | Discharge: 2014-02-07 | Disposition: A | Discharge status: Home / Self Care | Payer: Medicare Other | Source: Home / Self Care | Attending: Family Medicine | Admitting: Family Medicine

## 2014-02-07 ENCOUNTER — Encounter (HOSPITAL_COMMUNITY): Payer: Self-pay | Admitting: Emergency Medicine

## 2014-02-07 DIAGNOSIS — J40 Bronchitis, not specified as acute or chronic: Secondary | ICD-10-CM

## 2014-02-07 DIAGNOSIS — R0989 Other specified symptoms and signs involving the circulatory and respiratory systems: Secondary | ICD-10-CM

## 2014-02-07 MED ORDER — IPRATROPIUM-ALBUTEROL 0.5-2.5 (3) MG/3ML IN SOLN
RESPIRATORY_TRACT | Status: AC
  Filled 2014-02-07: qty 3

## 2014-02-07 MED ORDER — ALBUTEROL SULFATE HFA 108 (90 BASE) MCG/ACT IN AERS: 1.0000 | INHALATION_SPRAY | Freq: Four times a day (QID) | RESPIRATORY_TRACT | Status: AC | PRN

## 2014-02-07 MED ORDER — PREDNISONE 10 MG PO TABS: ORAL_TABLET | ORAL | Status: AC

## 2014-02-07 MED ORDER — PREDNISONE 20 MG PO TABS
60.0000 mg | ORAL_TABLET | Freq: Once | ORAL | Status: AC
  Administered 2014-02-07: 60 mg via ORAL

## 2014-02-07 MED ORDER — PREDNISONE 20 MG PO TABS
ORAL_TABLET | ORAL | Status: AC
  Filled 2014-02-07: qty 3

## 2014-02-07 MED ORDER — ALBUTEROL SULFATE (2.5 MG/3ML) 0.083% IN NEBU
5.0000 mg | INHALATION_SOLUTION | Freq: Once | RESPIRATORY_TRACT | Status: AC
  Administered 2014-02-07: 5 mg via RESPIRATORY_TRACT

## 2014-02-07 MED ORDER — IPRATROPIUM BROMIDE 0.06 % NA SOLN: 2.0000 | Freq: Four times a day (QID) | NASAL | Status: AC

## 2014-02-07 MED ORDER — ALBUTEROL SULFATE (2.5 MG/3ML) 0.083% IN NEBU
INHALATION_SOLUTION | RESPIRATORY_TRACT | Status: AC
  Filled 2014-02-07: qty 6

## 2014-02-07 MED ORDER — IPRATROPIUM BROMIDE 0.02 % IN SOLN
0.5000 mg | Freq: Once | RESPIRATORY_TRACT | Status: AC
  Administered 2014-02-07: 0.5 mg via RESPIRATORY_TRACT

## 2014-02-07 MED ORDER — IPRATROPIUM BROMIDE 0.02 % IN SOLN
RESPIRATORY_TRACT | Status: AC
Start: 2014-02-07 — End: 2014-02-07
  Filled 2014-02-07: qty 2.5

## 2014-02-07 NOTE — ED Notes (Signed)
Pt c/o persistent cough, back pain, congestion, runny nose onset 3 weeks Reports she was dx w/chronic bronchitis 3 weeks ago by PCP Given Virtussin and taking OTC cold meds w/no relief Denies f/v/n/d Alert w/no signs of acute distress.

## 2014-02-07 NOTE — Discharge Instructions (Signed)

## 2014-02-07 NOTE — ED Provider Notes (Signed)
CSN: [id number]     Arrival date & time 02/07/14  1719 History   First MD Initiated Contact with Patient 02/07/14 1828     Chief Complaint  Patient presents with  . URI   (Consider location/radiation/quality/duration/timing/severity/associated sxs/prior Treatment) HPI Comments: Patient states she was diagnosed with chronic bronchitis by her PCP in [place] about 3 weeks ago and was prescribed medication for cough and had stopped smoking and her symptoms were beginning to improve. Then over this past weekend she drove down from [place] to [place] for her class reunion and decided to purchase and smoke a pack of cigarettes while driving down. Has now had 2-3 days of nasal congestion and cough with occasional wheezing and chest tightness. No fever/chills or dyspnea. No chest pain. States back is sore from coughing. No calf pain, swelling. No pleuritic chest pain.   Patient is a 68 y.o. female presenting with URI. The history is provided by the patient.  URI Presenting symptoms: congestion and cough   Presenting symptoms: no ear pain, no fatigue, no fever, no rhinorrhea and no sore throat   Severity:  Moderate Onset quality:  Gradual Duration:  3 days Associated symptoms: wheezing     Past Medical History  Diagnosis Date  . Stroke     left cerebellar- asymptomatic  . Hypertension   . Hyperlipidemia   . Diabetes mellitus     borderline  . Thoracic aneurysm     [facility]  . Hx of colonic polyps   . COPD (chronic obstructive pulmonary disease)   . Diverticulitis   . GERD (gastroesophageal reflux disease)   . Cerebrovascular accident   . Chronic insomnia    Past Surgical History  Procedure Laterality Date  . Abdominal hysterectomy    . Thoracic aortic aneurysm repair    . Hemi-microdiscectomy lumbar laminectomy level 4  08/02/11   Family History  Problem Relation Age of Onset  . Stroke Mother   . Coronary artery disease Neg Hx   . Diabetes Neg Hx     History  Substance Use Topics  . Smoking status: Former Smoker -- 0.50 packs/day for 40 years    Types: Cigarettes    Quit date: 01/10/2011  . Smokeless tobacco: Not on file  . Alcohol Use: No   OB History   Grav Para Term Preterm Abortions TAB SAB Ect Mult Living                 Review of Systems  Constitutional: Negative for fever, chills and fatigue.  HENT: Positive for congestion. Negative for ear pain, mouth sores, nosebleeds, rhinorrhea, sinus pressure and sore throat.   Eyes: Negative.   Respiratory: Positive for cough, chest tightness and wheezing. Negative for shortness of breath and stridor.   Cardiovascular: Negative.   Gastrointestinal: Negative.   Musculoskeletal: Positive for back pain.  Skin: Negative.   Neurological: Negative for dizziness, syncope, weakness and light-headedness.    Allergies  Codeine; Contrast media; Iohexol; Penicillins; Shellfish allergy; and Sertraline hcl  Home Medications   Prior to Admission medications   Medication Sig Start Date End Date Taking? Authorizing Provider  amLODipine (NORVASC) 10 MG tablet TAKE 1 TABLET DAILY 11/22/12  Yes Etta Grandchildhomas L Jones, MD  aspirin 81 MG EC tablet Take 81 mg by mouth daily.     Yes Historical Provider, MD  guaiFENesin-codeine (VIRTUSSIN A/C) 100-10 MG/5ML syrup Take 5 mLs by mouth 3 (three) times daily as needed for cough.   Yes Historical  Provider, MD  LISINOPRIL PO Take by mouth.   Yes Historical Provider, MD  metoprolol succinate (TOPROL-XL) 50 MG 24 hr tablet TAKE 1 TABLET DAILY 10/05/13  Yes Etta Grandchildhomas L Jones, MD  cyclobenzaprine (FLEXERIL) 5 MG tablet Take 1 tablet (5 mg total) by mouth every 8 (eight) hours as needed. 07/07/12   Etta Grandchildhomas L Jones, MD  diphenhydrAMINE (BENADRYL) 50 MG capsule Take 50 mg by mouth as directed.      Historical Provider, MD  EPINEPHrine (EPIPEN 2-PAK) 0.3 mg/0.3 mL DEVI Inject 0.3 mLs (0.3 mg total) into the muscle once. 04/07/12   Etta Grandchildhomas L Jones, MD  esomeprazole (NEXIUM) 40  MG capsule Take 1 capsule (40 mg total) by mouth daily. 07/01/11 06/30/12  Etta Grandchildhomas L Jones, MD  Eszopiclone (ESZOPICLONE) 3 MG TABS Take 1 tablet (3 mg total) by mouth at bedtime. Take immediately before bedtime 10/25/12   Etta Grandchildhomas L Jones, MD  ezetimibe-simvastatin (VYTORIN) 10-20 MG per tablet Take 1 tablet by mouth at bedtime. 07/07/12   Etta Grandchildhomas L Jones, MD  Multiple Vitamins-Minerals (ONE-A-DAY 50 PLUS PO) Take 1 each by mouth daily.      Historical Provider, MD   BP 139/69  Pulse 78  Temp(Src) 98.8 F (37.1 C) (Oral)  Resp 18  SpO2 95% Physical Exam  Nursing note and vitals reviewed. Constitutional: She is oriented to person, place, and time. She appears well-developed and well-nourished. No distress.  HENT:  Head: Normocephalic and atraumatic.  Eyes: Conjunctivae are normal. No scleral icterus.  Neck: Normal range of motion. Neck supple.  Cardiovascular: Normal rate, regular rhythm and normal heart sounds.   Pulmonary/Chest: Effort normal. No respiratory distress.  Mild end-expiratory wheezes  Musculoskeletal: Normal range of motion. She exhibits no edema and no tenderness.  Neurological: She is alert and oriented to person, place, and time.  Skin: Skin is warm and dry. No rash noted. No erythema.  Psychiatric: She has a normal mood and affect. Her behavior is normal.    ED Course  Procedures (including critical care time) Labs Review Labs Reviewed - No data to display  Imaging Review No results found.   MDM   1. Bronchitis    Likely acute exacerbation of chronic bronchitis. Encouraged to discontinue smoking. Will treat with bronchodilators, short course of oral prednisone and Atrovent nasal spray for nasal congestion. Advise close follow up with PCP upon returning home to [place]. Hx and exam do not suggest PE. Patient given 5 mg albuterol and 0.5mg  [facility].     Jess BartersJennifer Lee [facility], [place] 02/07/14 [id number]

## 2014-02-08 NOTE — ED Provider Notes (Signed)
Medical screening examination/treatment/procedure(s) were performed by a resident physician or non-physician practitioner and as the supervising physician I was immediately available for consultation/collaboration.  Darek Eifler, MD    Laquan Beier S Sayler Mickiewicz, MD 02/08/14 0747
